# Patient Record
Sex: Female | Born: 1990 | Race: White | Hispanic: No | Marital: Married | Smoking: Never smoker
Health system: Southern US, Community
[De-identification: ages and names within clinical notes are randomized; demographics above are authoritative.]

## PROBLEM LIST (undated history)

## (undated) HISTORY — PX: NO PAST SURGERIES: SHX2092

---

## 2017-05-15 ENCOUNTER — Telehealth: Payer: Self-pay | Admitting: Podiatry

## 2017-05-15 NOTE — Telephone Encounter (Signed)
Left voicemail for pt to call to possible move appt to tomorrow at 900am. We had a cancellation.

## 2017-05-16 ENCOUNTER — Ambulatory Visit: Payer: BC Managed Care – PPO | Admitting: Podiatry

## 2017-05-16 DIAGNOSIS — L600 Ingrowing nail: Secondary | ICD-10-CM | POA: Diagnosis not present

## 2017-05-16 MED ORDER — GENTAMICIN SULFATE 0.1 % EX CREA: 1.0000 "application " | TOPICAL_CREAM | Freq: Three times a day (TID) | CUTANEOUS | 0 refills | Status: DC

## 2017-05-16 MED ORDER — DOXYCYCLINE HYCLATE 100 MG PO TABS: 100.0000 mg | ORAL_TABLET | Freq: Two times a day (BID) | ORAL | 0 refills | Status: DC

## 2017-05-16 NOTE — Progress Notes (Signed)
   Subjective:    Patient ID: Melissa Christensen, female    DOB: [birth year], 27 y.o.   MRN: [id number]  HPI    Review of Systems  All other systems reviewed and are negative.      Objective:   Physical Exam        Assessment & Plan:

## 2017-05-18 NOTE — Progress Notes (Signed)
   Subjective: Patient presents today for evaluation of pain to the lateral borders of bilateral great toes that began two weeks ago. She reports associated erythema, swelling and green drainage. Patient is concerned for possible ingrown nails. She has not done anything to treat the symptoms. Applying pressure increases the pain. There are no alleviating factors noted. Patient presents today for further treatment and evaluation.  No past medical history on file.  Objective:  General: Well developed, nourished, in no acute distress, alert and oriented x3   Dermatology: Skin is warm, dry and supple bilateral. Lateral borders of bilateral great toes appears to be erythematous with evidence of an ingrowing nail. Pain on palpation noted to the border of the nail fold. The remaining nails appear unremarkable at this time. There are no open sores, lesions.  Vascular: Dorsalis Pedis artery and Posterior Tibial artery pedal pulses palpable. No lower extremity edema noted.   Neruologic: Grossly intact via light touch bilateral.  Musculoskeletal: Muscular strength within normal limits in all groups bilateral. Normal range of motion noted to all pedal and ankle joints.   Assesement: #1 Paronychia with ingrowing nail lateral borders of bilateral great toes #2 Pain in toe #3 Incurvated nail  Plan of Care:  1. Patient evaluated.  2. Discussed treatment alternatives and plan of care. Explained nail avulsion procedure and post procedure course to patient. 3. Patient opted for permanent partial nail avulsion.  4. Prior to procedure, local anesthesia infiltration utilized using 3 ml of a 50:50 mixture of 2% plain lidocaine and 0.5% plain marcaine in a normal hallux block fashion and a betadine prep performed.  5. Partial permanent nail avulsion with chemical matrixectomy performed using 3x30sec applications of phenol followed by alcohol flush.  6. Light dressing applied. 7. Prescription for gentamicin  cream provided to patient. 8. Prescription for Doxycycline #20 provided to patient.  9. Return to clinic in 2 weeks.   Felecia ShellingBrent M. Nanako Stopher, DPM [facility]  Dr. Felecia ShellingBrent M. Donnice Nielsen, DPM    [address]                                        [place], [place] [address]                Office [phone]  Fax [phone]

## 2017-05-20 ENCOUNTER — Ambulatory Visit: Payer: Self-pay | Admitting: Podiatry

## 2017-05-30 ENCOUNTER — Ambulatory Visit: Payer: BC Managed Care – PPO | Admitting: Podiatry

## 2018-03-05 ENCOUNTER — Ambulatory Visit: Payer: Self-pay | Admitting: Family Medicine

## 2018-03-10 ENCOUNTER — Ambulatory Visit: Payer: BC Managed Care – PPO | Admitting: Family Medicine

## 2018-03-10 ENCOUNTER — Encounter (INDEPENDENT_AMBULATORY_CARE_PROVIDER_SITE_OTHER): Payer: Self-pay

## 2018-03-10 ENCOUNTER — Encounter: Payer: Self-pay | Admitting: Family Medicine

## 2018-03-10 ENCOUNTER — Ambulatory Visit: Payer: Self-pay | Admitting: Family Medicine

## 2018-03-10 VITALS — BP 110/70 | HR 83 | Temp 98.5°F | Resp 16 | Ht 62.0 in | Wt 135.0 lb

## 2018-03-10 DIAGNOSIS — L308 Other specified dermatitis: Secondary | ICD-10-CM

## 2018-03-10 DIAGNOSIS — L309 Dermatitis, unspecified: Secondary | ICD-10-CM | POA: Insufficient documentation

## 2018-03-10 MED ORDER — TRIAMCINOLONE ACETONIDE 0.1 % EX CREA: 1.0000 "application " | TOPICAL_CREAM | Freq: Two times a day (BID) | CUTANEOUS | 1 refills | Status: DC

## 2018-03-10 MED ORDER — DESONIDE 0.05 % EX LOTN: TOPICAL_LOTION | CUTANEOUS | 0 refills | Status: DC

## 2018-03-10 NOTE — Progress Notes (Signed)
Name: Melissa Christensen   MRN: [id number]    DOB: 05-Nov-1990   Date:03/10/2018       Progress Note  Subjective  Chief Complaint  Chief Complaint  Patient presents with  . Establish Care  . Otalgia    right ear     HPI  Pt presents to establish care - she has been seeing OB/GYN, but has not had a PCP in several years.  She has a 97mo daughter at home, she is married, and has 2 dogs.   Eczema:  She notes that she has had dry skin/itching in the right ear for a few months. Does not have nasal congestion.  Does have some ear pressure when she sleeps on it. She also has a patch on the left posterior lower scalp that has been itching and scaling for a few months.  She saw a dermatologist for skin survey a few months ago, but did not mention these concerns then.  Patient Active Problem List   Diagnosis Date Noted  . Eczema 03/10/2018    Past Surgical History:  Procedure Laterality Date  . NO PAST SURGERIES      Family History  Problem Relation Age of Onset  . Hypertension Mother   . Healthy Father   . Healthy Sister   . Dementia Maternal Grandmother   . Emphysema Maternal Grandfather [id number]  . Diabetes Paternal Grandmother   . Heart attack Paternal Grandfather        In his 1650's  . Healthy Sister     Social History   Socioeconomic History  . Marital status: Married    Spouse name: Josh  . Number of children: 1  . Years of education: Not on file  . Highest education level: Not on file  Occupational History  . Not on file  Social Needs  . Financial resource strain: Not hard at all  . Food insecurity:    Worry: Never true    Inability: Never true  . Transportation needs:    Medical: No    Non-medical: No  Tobacco Use  . Smoking status: Never Smoker  . Smokeless tobacco: Never Used  Substance and Sexual Activity  . Alcohol use: Never    Frequency: Never  . Drug use: Never  . Sexual activity: Yes    Partners: Male    Birth control/protection: Pill  Lifestyle  .  Physical activity:    Days per week: 0 days    Minutes per session: 0 min  . Stress: Not at all  Relationships  . Social connections:    Talks on phone: More than three times a week    Gets together: More than three times a week    Attends religious service: More than 4 times per year    Active member of club or organization: Yes    Attends meetings of clubs or organizations: Never    Relationship status: Married  . Intimate partner violence:    Fear of current or ex partner: No    Emotionally abused: No    Physically abused: No    Forced sexual activity: No  Other Topics Concern  . Not on file  Social History Narrative   She has a 97mo daughter at home, she is married, and has 2 dogs.      Current Outpatient Medications:  .  norgestrel-ethinyl estradiol (LO/OVRAL,CRYSELLE) 0.3-30 MG-MCG tablet, Take by mouth., Disp: , Rfl:  .  desonide (DESOWEN) 0.05 % lotion, Apply twice daily in thin layer  to the outer aspect of the ear canal only - do not apply internally., Disp: 59 mL, Rfl: 0 .  triamcinolone cream (KENALOG) 0.1 %, Apply 1 application topically 2 (two) times daily., Disp: 30 g, Rfl: 1  No Known Allergies  I personally reviewed active problem list, medication list, allergies, family history, social history, health maintenance with the patient/caregiver today.   ROS Constitutional: Negative for fever or weight change.  Respiratory: Negative for cough and shortness of breath.   Cardiovascular: Negative for chest pain or palpitations.  Gastrointestinal: Negative for abdominal pain, no bowel changes.  Musculoskeletal: Negative for gait problem or joint swelling.  Skin: Positive for rash.  Neurological: Negative for dizziness or headache.  No other specific complaints in a complete review of systems (except as listed in HPI above).  Objective  Vitals:   03/10/18 0723  BP: 110/70  Pulse: 83  Resp: 16  Temp: 98.5 F (36.9 C)  TempSrc: Oral  SpO2: 99%  Weight: 135  lb (61.2 kg)  Height: 5\' 2"  (1.575 m)    Body mass index is 24.69 kg/m.  Physical Exam Constitutional: Patient appears well-developed and well-nourished. No distress.  HEENT: head atraumatic, normocephalic, pupils equal and reactive to light, Bilateral TM's without erythema or effusion,  bilateral maxillary and frontal sinuses are non-tender, neck supple without lymphadenopathy, throat within normal limits - no erythema or exudate, no tonsillar swelling Cardiovascular: Normal rate, regular rhythm and normal heart sounds.  No murmur heard. No BLE edema. Pulmonary/Chest: Effort normal and breath sounds clear bilaterally. No respiratory distress. Psychiatric: Patient has a normal mood and affect. behavior is normal. Judgment and thought content normal. Skin: There a an approx quarter-sized patch of scaling skin with mild underlying erythema to the lower posterior scalp, just within the hairline to the left.  The entrance tot he right ear canal exhibits small amount of scaling and very mild erythema, canals are otherwise WNL bilaterally.  No results found for this or any previous visit (from the past 72 hour(s)).  PHQ2/9: Depression screen PHQ 2/9 03/10/2018  Decreased Interest 0  Down, Depressed, Hopeless 0  PHQ - 2 Score 0  Altered sleeping 0  Tired, decreased energy 0  Change in appetite 0  Feeling bad or failure about yourself  0  Trouble concentrating 0  Moving slowly or fidgety/restless 0  Suicidal thoughts 0  PHQ-9 Score 0  Difficult doing work/chores Not difficult at all    Fall Risk: Fall Risk  03/10/2018  Falls in the past year? 0  Number falls in past yr: 0  Injury with Fall? 0   Assessment & Plan  1. Other eczema - desonide (DESOWEN) 0.05 % lotion; Apply twice daily in thin layer to the outer aspect of the ear canal only - do not apply internally.  Dispense: 59 mL; Refill: 0 - triamcinolone cream (KENALOG) 0.1 %; Apply 1 application topically 2 (two) times daily.   Dispense: 30 g; Refill: 1

## 2018-05-01 ENCOUNTER — Encounter: Payer: Self-pay | Admitting: Family Medicine

## 2018-05-01 ENCOUNTER — Ambulatory Visit: Payer: BC Managed Care – PPO | Admitting: Family Medicine

## 2018-05-01 VITALS — BP 120/70 | HR 88 | Temp 98.8°F | Resp 14 | Ht 63.0 in | Wt 135.2 lb

## 2018-05-01 DIAGNOSIS — O99345 Other mental disorders complicating the puerperium: Secondary | ICD-10-CM | POA: Diagnosis not present

## 2018-05-01 DIAGNOSIS — F418 Other specified anxiety disorders: Secondary | ICD-10-CM | POA: Diagnosis not present

## 2018-05-01 DIAGNOSIS — N644 Mastodynia: Secondary | ICD-10-CM | POA: Diagnosis not present

## 2018-05-01 LAB — COMPLETE METABOLIC PANEL WITH GFR
AG Ratio: 1.3 (calc) (ref 1.0–2.5)
ALKALINE PHOSPHATASE (APISO): 48 U/L (ref 33–115)
ALT: 10 U/L (ref 6–29)
AST: 13 U/L (ref 10–30)
Albumin: 4.3 g/dL (ref 3.6–5.1)
BUN: 13 mg/dL (ref 7–25)
CALCIUM: 9.9 mg/dL (ref 8.6–10.2)
CO2: 25 mmol/L (ref 20–32)
CREATININE: 0.8 mg/dL (ref 0.50–1.10)
Chloride: 104 mmol/L (ref 98–110)
GFR, Est African American: 117 mL/min/{1.73_m2} (ref >=60)
GFR, Est Non African American: 101 mL/min/{1.73_m2} (ref >=60)
GLUCOSE: 92 mg/dL (ref 65–99)
Globulin: 3.4 g/dL (calc) (ref 1.9–3.7)
Potassium: 4.5 mmol/L (ref 3.5–5.3)
Sodium: 139 mmol/L (ref 135–146)
Total Bilirubin: 0.4 mg/dL (ref 0.2–1.2)
Total Protein: 7.7 g/dL (ref 6.1–8.1)

## 2018-05-01 LAB — CBC WITH DIFFERENTIAL/PLATELET
ABSOLUTE MONOCYTES: 599 {cells}/uL (ref 200–950)
Basophils Absolute: 73 cells/uL (ref 0–200)
Basophils Relative: 1 %
Eosinophils Absolute: 51 cells/uL (ref 15–500)
Eosinophils Relative: 0.7 %
HEMATOCRIT: 40.3 % (ref 35.0–45.0)
Hemoglobin: 13.2 g/dL (ref 11.7–15.5)
LYMPHS ABS: 2832 {cells}/uL (ref 850–3900)
MCH: 26.7 pg — ABNORMAL LOW (ref 27.0–33.0)
MCHC: 32.8 g/dL (ref 32.0–36.0)
MCV: 81.4 fL (ref 80.0–100.0)
MPV: 10.5 fL (ref 7.5–12.5)
Monocytes Relative: 8.2 %
NEUTROS ABS: 3745 {cells}/uL (ref 1500–7800)
Neutrophils Relative %: 51.3 %
PLATELETS: 391 10*3/uL (ref 140–400)
RBC: 4.95 10*6/uL (ref 3.80–5.10)
RDW: 13.4 % (ref 11.0–15.0)
TOTAL LYMPHOCYTE: 38.8 %
WBC: 7.3 10*3/uL (ref 3.8–10.8)

## 2018-05-01 LAB — TSH: TSH: 1.63 mIU/L

## 2018-05-01 MED ORDER — ESCITALOPRAM OXALATE 5 MG PO TABS
5.0000 mg | ORAL_TABLET | Freq: Every day | ORAL | 1 refills | Status: DC
Start: 2018-05-01 — End: 2018-05-22

## 2018-05-01 NOTE — Progress Notes (Signed)
Name: Melissa Christensen   MRN: [id number]    DOB: December 28, 1990   Date:05/01/2018       Progress Note  Subjective  Chief Complaint  Chief Complaint  Patient presents with  . Shoulder Pain    that radiates to breast on right for months  . Anxiety    since child birth    HPI  RIGHT breast, axillary, and shoulder blade pain ongoing for several months - was seen by GYN and had normal breast exam in October 2019 despite having the pain then. She will have sharp intermittent pains throughout the day. She states not related to her menses, not related to caffeine. She is not breastfeeding. Does carry her 17mo daughter on her right side often.  No nipple discharge, though she notes some nipple discomfort with the pain at times.   Anxiety/Fatigue: Ongoing since her daughter was born.  She has a constant fear that something bad is going to happen to her daugher, she worries about taking her daughter out of the home with her.  She feels irritable with her husband and other caregivers when they don't do things the way that she prefers.    GAD 7 : Generalized Anxiety Score 05/01/2018  Nervous, Anxious, on Edge 3  Control/stop worrying 3  Worry too much - different things 3  Trouble relaxing 1  Restless 0  Easily annoyed or irritable 3  Afraid - awful might happen 3  Total GAD 7 Score 16  Anxiety Difficulty Extremely difficult      Patient Active Problem List   Diagnosis Date Noted  . Eczema 03/10/2018    Social History   Tobacco Use  . Smoking status: Never Smoker  . Smokeless tobacco: Never Used  Substance Use Topics  . Alcohol use: Never    Frequency: Never     Current Outpatient Medications:  .  desonide (DESOWEN) 0.05 % lotion, Apply twice daily in thin layer to the outer aspect of the ear canal only - do not apply internally., Disp: 59 mL, Rfl: 0 .  norgestrel-ethinyl estradiol (LO/OVRAL,CRYSELLE) 0.3-30 MG-MCG tablet, Take by mouth., Disp: , Rfl:  .  triamcinolone cream  (KENALOG) 0.1 %, Apply 1 application topically 2 (two) times daily., Disp: 30 g, Rfl: 1  No Known Allergies  I personally reviewed active problem list, medication list, allergies, notes from last encounter with the patient/caregiver today.  ROS  Constitutional: Negative for fever or weight change.  Respiratory: Negative for cough and shortness of breath.   Cardiovascular: Negative for chest pain or palpitations.  Gastrointestinal: Negative for abdominal pain, no bowel changes.  Musculoskeletal: Negative for gait problem or joint swelling.  Skin: Negative for rash.  Neurological: Negative for dizziness or headache.  No other specific complaints in a complete review of systems (except as listed in HPI above).  Objective  Vitals:   05/01/18 1346  BP: 120/70  Pulse: 88  Resp: 14  Temp: 98.8 F (37.1 C)  TempSrc: Oral  SpO2: 98%  Weight: 135 lb 3.2 oz (61.3 kg)  Height: 5\' 3"  (1.6 m)   Body mass index is 23.95 kg/m.  Nursing Note and Vital Signs reviewed.  Physical Exam  Constitutional: Patient appears well-developed and well-nourished. No distress.  HENT: Head: Normocephalic and atraumatic. Ears: B TMs ok, no erythema or effusion; Nose: Nose normal. Mouth/Throat: Oropharynx is clear and moist. No oropharyngeal exudate.  Eyes: Conjunctivae and EOM are normal. Pupils are equal, round, and reactive to light. No scleral icterus.  Neck: Normal range of motion. Neck supple. No JVD present. No thyromegaly present.  Cardiovascular: Normal rate, regular rhythm and normal heart sounds.  No murmur heard. No BLE edema. Pulmonary/Chest: Effort normal and breath sounds normal. No respiratory distress. Abdominal: Soft. Bowel sounds are normal, no distension. There is no tenderness. no masses Breast: no nipple discharge or rashes; there is some dense irregular tissue to the RIGHT upper outer quadrant of the right breast, left breast is normal. Neurological: he is alert and oriented to  person, place, and time. No cranial nerve deficit. Coordination, balance, strength, speech and gait are normal.  Skin: Skin is warm and dry. No rash noted. No erythema.  Psychiatric: Patient has a normal mood and affect. behavior is normal. Judgment and thought content normal.  No results found for this or any previous visit (from the past 72 hour(s)).  Assessment & Plan  1. Postpartum anxiety - escitalopram (LEXAPRO) 5 MG tablet; Take 1 tablet (5 mg total) by mouth daily.  Dispense: 30 tablet; Refill: 1 - TSH - COMPLETE METABOLIC PANEL WITH GFR - CBC w/Diff/Platelet  2. Breast pain, right - TSH - COMPLETE METABOLIC PANEL WITH GFR - CBC w/Diff/Platelet - US BREAST LTD UNI RIGHT INC AXILLA; Future - US BREAST LTD UNI LEFT INC AXILLA; Future

## 2018-05-01 NOTE — Patient Instructions (Signed)
[email or web address] therapist finder   Perinatal Anxiety When a woman feels excessive tension or worry (anxiety) during pregnancy or during the first 12 months after she gives birth, she has a condition called perinatal anxiety. Anxiety can interfere with work, school, relationships, and other everyday activities. If it is not managed properly, it can also cause problems in the mother and her baby.  If you are pregnant and you have symptoms of an anxiety disorder, it is important to talk with your health care provider. What are the causes? The exact cause of this condition is not known. Hormonal changes during and after pregnancy may play a role in causing perinatal anxiety. What increases the risk? You are more likely to develop this condition if:  You have a personal or family history of depression, anxiety, or mood disorders.  You experience a stressful life event during pregnancy, such as the death of a loved one.  You have a lot of regular life stress, such as being a single parent.  You have thyroid problems. What are the signs or symptoms? Perinatal anxiety can be different for everyone. It may include:  Panic attacks (panic disorder). These are intense episodes of fear or discomfort that may also cause sweating, nausea, shortness of breath, or fear of dying. They usually last 5-15 minutes.  Reliving an upsetting (traumatic) event through distressing thoughts, dreams, or flashbacks (post-traumatic stress disorder, or PTSD).  Excessive worry about multiple problems (generalized anxiety disorder).  Fear and stress about leaving certain people or loved ones (separation anxiety).  Performing repetitive tasks (compulsions) to relieve stress or worry (obsessive compulsive disorder, or OCD).  Fear of certain objects or situations (phobias).  Excessive worrying, such as a constant feeling that something bad is going to happen.  Inability to relax.  Difficulty  concentrating.  Sleep problems.  Frequent nightmares or disturbing thoughts. How is this diagnosed? This condition is diagnosed based on a physical exam and mental evaluation. In some cases, your health care provider may use an anxiety screening tool. These tools include a list of questions that can help a health care provider diagnose anxiety. Your health care provider may refer you to a mental health expert who specializes in anxiety. How is this treated? This condition may be treated with:  Medicines. Your health care provider will only give you medicines that have been proven safe for pregnancy and breastfeeding.  Talk therapy with a mental health professional to help change your patterns of thinking (cognitive behavioral therapy).  Mindfulness-based stress reduction.  Other relaxation therapies, such as deep breathing or guided muscle relaxation.  Support groups. Follow these instructions at home: Lifestyle  Do not use any products that contain nicotine or tobacco, such as cigarettes and e-cigarettes. If you need help quitting, ask your health care provider.  Do not use alcohol when you are pregnant. After your baby is born, limit alcohol intake to no more than 1 drink a day. One drink equals 12 oz of beer, 5 oz of wine, or 1 oz of hard liquor.  Consider joining a support group for new mothers. Ask your health care provider for recommendations.  Take good care of yourself. Make sure you: ? Get plenty of sleep. If you are having trouble sleeping, talk with your health care provider. ? Eat a healthy diet. This includes plenty of fruits and vegetables, whole grains, and lean proteins. ? Exercise regularly, as told by your health care provider. Ask your health care provider what exercises are safe for you.  General instructions  Take over-the-counter and prescription medicines only as told by your health care provider.  Talk with your partner or family members about your feelings  during pregnancy. Share any concerns or fears that you may have.  Ask for help with tasks or chores when you need it. Ask friends and family members to provide meals, watch your children, or help with cleaning.  Keep all follow-up visits as told by your health care provider. This is important. Contact a health care provider if:  You (or people close to you) notice that you have any symptoms of anxiety or depression.  You have anxiety and your symptoms get worse.  You experience side effects from medicines, such as nausea or sleep problems. Get help right away if:  You feel like hurting yourself, your baby, or someone else. If you ever feel like you may hurt yourself or others, or have thoughts about taking your own life, get help right away. You can go to your nearest emergency department or call:  Your local emergency services (911 in the [place]  A suicide crisis helpline, such as the National Suicide Prevention Lifeline at 319-545-8812. This is open 24 hours a day. Summary  Perinatal anxiety is when a woman feels excessive tension or worry during pregnancy or during the first 12 months after she gives birth.  Perinatal anxiety may include panic attacks, post-traumatic stress disorder, separation anxiety, phobias, or generalized anxiety.  Perinatal anxiety can cause physical health problems in the mother and baby if not properly managed.  This condition is treated with medicines, talk therapy, stress reduction therapies, or a combination of two or more treatments.  Talk with your partner or family members about your concerns or fears. Do not be afraid to ask for help. This information is not intended to replace advice given to you by your health care provider. Make sure you discuss any questions you have with your health care provider. Document Released: 05/29/2016 Document Revised: 05/29/2016 Document Reviewed: 05/29/2016 Elsevier Interactive Patient Education  2019 [employer].   12 Ways to [employer] Anxiety  ?Anxiety is normal human sensation. It is what helped our ancestors survive the pitfalls of the wilderness. Anxiety is defined as experiencing worry or nervousness about an imminent event or something with an uncertain outcome. It is a feeling experienced by most people at some point in their lives. Anxiety can be triggered by a very personal issue, such as the illness of a loved one, or an event of global proportions, such as a refugee crisis. Some of the symptoms of anxiety are:  Feeling restless.  Having a feeling of impending danger.  Increased heart rate.  Rapid breathing. Sweating.  Shaking.  Weakness or feeling tired.  Difficulty concentrating on anything except the current worry.  Insomnia.  Stomach or bowel problems. What can we do about anxiety we may be feeling? There are many techniques to help manage stress and relax. Here are 12 ways you can reduce your anxiety almost immediately: 1. Turn off the constant feed of information. Take a social media sabbatical. Studies have shown that social media directly contributes to social anxiety.  2. Monitor your television viewing habits. Are you watching shows that are also contributing to your anxiety, such as 24-hour news stations? Try watching something else, or better yet, nothing at all. Instead, listen to music, read an inspirational book or practice a hobby. 3. Eat nutritious meals. Also, don't skip meals and keep healthful snacks on hand. Hunger and  poor diet contributes to feeling anxious. 4. Sleep. Sleeping on a regular schedule for at least seven to eight hours a night will do wonders for your outlook when you are awake. 5. Exercise. Regular exercise will help rid your body of that anxious energy and help you get more restful sleep. 6. Try deep (diaphragmatic) breathing. Inhale slowly through your nose for five seconds and exhale through your mouth. 7. Practice acceptance and gratitude. When anxiety  hits, accept that there are things out of your control that shouldn't be of immediate concern.  8. Seek out humor. When anxiety strikes, watch a funny video, read jokes or call a friend who makes you laugh. Laughter is healing for our bodies and releases endorphins that are calming. 9. Stay positive. Take the effort to replace negative thoughts with positive ones. Try to see a stressful situation in a positive light. Try to come up with solutions rather than dwelling on the problem. 10. Figure out what triggers your anxiety. Keep a journal and make note of anxious moments and the events surrounding them. This will help you identify triggers you can avoid or even eliminate. 11. Talk to someone. Let a trusted friend, family member or even trained professional know that you are feeling overwhelmed and anxious. Verbalize what you are feeling and why.  12. Volunteer. If your anxiety is triggered by a crisis on a large scale, become an advocate and work to resolve the problem that is causing you unease. Anxiety is often unwelcome and can become overwhelming. If not kept in check, it can become a disorder that could require medical treatment. However, if you take the time to care for yourself and avoid the triggers that make you anxious, you will be able to find moments of relaxation and clarity that make your life much more enjoyable.

## 2018-05-13 ENCOUNTER — Ambulatory Visit
Admission: RE | Admit: 2018-05-13 | Discharge: 2018-05-13 | Disposition: A | Discharge status: Home / Self Care | Payer: BC Managed Care – PPO | Source: Ambulatory Visit | Attending: Family Medicine | Admitting: Family Medicine

## 2018-05-13 DIAGNOSIS — N644 Mastodynia: Secondary | ICD-10-CM | POA: Insufficient documentation

## 2018-05-22 ENCOUNTER — Other Ambulatory Visit: Payer: Self-pay

## 2018-05-22 ENCOUNTER — Encounter: Payer: Self-pay | Admitting: Family Medicine

## 2018-05-22 ENCOUNTER — Ambulatory Visit (INDEPENDENT_AMBULATORY_CARE_PROVIDER_SITE_OTHER): Payer: BC Managed Care – PPO | Admitting: Family Medicine

## 2018-05-22 VITALS — BP 110/70 | HR 82 | Temp 98.0°F | Resp 16 | Ht 63.0 in | Wt 135.1 lb

## 2018-05-22 DIAGNOSIS — O99345 Other mental disorders complicating the puerperium: Secondary | ICD-10-CM | POA: Insufficient documentation

## 2018-05-22 DIAGNOSIS — F418 Other specified anxiety disorders: Secondary | ICD-10-CM | POA: Diagnosis not present

## 2018-05-22 MED ORDER — ESCITALOPRAM OXALATE 10 MG PO TABS: 10.0000 mg | ORAL_TABLET | Freq: Every day | ORAL | 1 refills | Status: DC

## 2018-05-22 NOTE — Patient Instructions (Addendum)
Take 7.5 of Lexapro daily (1/5 of the 5mg  tablets) until you run out.  Then start 10mg  once daily tablets.   [email or web address] therapist finder [employer]

## 2018-05-22 NOTE — Assessment & Plan Note (Signed)
Increase lexapro to 10mg . GAD7 score is improved to score of 10.  She needs to find a counselor - see AVS.

## 2018-05-22 NOTE — Progress Notes (Signed)
Established Patient Office Visit  Subjective:  Patient ID: Melissa Christensen, female    DOB: May 16, 1990  Age: 28 y.o. MRN: [id number]  CC:  Chief Complaint  Patient presents with  . Follow-up    HPI Melissa Christensen presents for follow up:  Anxiety/Fatigue: Ongoing since her daughter was born.  She has a constant fear that something bad is going to happen to her daugher, she worries about taking her daughter out of the home with her.  She feels irritable with her husband and other caregivers when they don't do things the way that she prefers.  She is not breastfeeding. Started Lexapro 5mg  daily 3 weeks ago, and she has noticed some improvement in her anxiety - GAD7 has improved since last visit, however she would like to increase dose today for additional efficacy.  Daughter is still getting up 1-2 times in the middle of the night. Discussed anxiolytics including buspar, and she declines at this time.  We will increase Lexapro today.  Depression screen [facility] 2/9 05/22/2018 05/01/2018 03/10/2018  Decreased Interest 0 1 0  Down, Depressed, Hopeless 0 0 0  PHQ - 2 Score 0 1 0  Altered sleeping 1 0 0  Tired, decreased energy 1 3 0  Change in appetite 0 0 0  Feeling bad or failure about yourself  0 0 0  Trouble concentrating 0 0 0  Moving slowly or fidgety/restless 0 0 0  Suicidal thoughts 0 0 0  PHQ-9 Score 2 4 0  Difficult doing work/chores Not difficult at all Not difficult at all Not difficult at all   GAD 7 : Generalized Anxiety Score 05/22/2018 05/01/2018  Nervous, Anxious, on Edge 2 3  Control/stop worrying 2 3  Worry too much - different things 1 3  Trouble relaxing 1 1  Restless 0 0  Easily annoyed or irritable 2 3  Afraid - awful might happen 2 3  Total GAD 7 Score 10 16  Anxiety Difficulty Not difficult at all Extremely difficult   No past medical history on file.  Past Surgical History:  Procedure Laterality Date  . NO PAST SURGERIES      Family History  Problem Relation Age  of Onset  . Hypertension Mother   . Healthy Father   . Healthy Sister   . Dementia Maternal Grandmother   . Emphysema Maternal Grandfather 31  . Diabetes Paternal Grandmother   . Heart attack Paternal Grandfather        In his 51's  . Healthy Sister     Social History   Socioeconomic History  . Marital status: Married    Spouse name: Josh  . Number of children: 1  . Years of education: Not on file  . Highest education level: Not on file  Occupational History  . Not on file  Social Needs  . Financial resource strain: Not hard at all  . Food insecurity:    Worry: Never true    Inability: Never true  . Transportation needs:    Medical: No    Non-medical: No  Tobacco Use  . Smoking status: Never Smoker  . Smokeless tobacco: Never Used  Substance and Sexual Activity  . Alcohol use: Never    Frequency: Never  . Drug use: Never  . Sexual activity: Yes    Partners: Male    Birth control/protection: Pill  Lifestyle  . Physical activity:    Days per week: 0 days    Minutes per session: 0 min  .  Stress: Not at all  Relationships  . Social connections:    Talks on phone: More than three times a week    Gets together: More than three times a week    Attends religious service: More than 4 times per year    Active member of club or organization: Yes    Attends meetings of clubs or organizations: Never    Relationship status: Married  . Intimate partner violence:    Fear of current or ex partner: No    Emotionally abused: No    Physically abused: No    Forced sexual activity: No  Other Topics Concern  . Not on file  Social History Narrative   She has a 15mo daughter at home, she is married, and has 2 dogs.     Outpatient Medications Prior to Visit  Medication Sig Dispense Refill  . desonide (DESOWEN) 0.05 % lotion Apply twice daily in thin layer to the outer aspect of the ear canal only - do not apply internally. 59 mL 0  . triamcinolone cream (KENALOG) 0.1 % Apply  1 application topically 2 (two) times daily. 30 g 1  . escitalopram (LEXAPRO) 5 MG tablet Take 1 tablet (5 mg total) by mouth daily. 30 tablet 1  . norgestrel-ethinyl estradiol (LO/OVRAL,CRYSELLE) 0.3-30 MG-MCG tablet Take by mouth.     No facility-administered medications prior to visit.     No Known Allergies  ROS Review of Systems  Constitutional: Negative for activity change and appetite change.  HENT: Negative.   Respiratory: Negative.  Negative for shortness of breath.   Cardiovascular: Negative.  Negative for chest pain.  Gastrointestinal: Negative.  Negative for constipation, diarrhea and nausea.  Musculoskeletal: Negative.   Skin: Negative.   Psychiatric/Behavioral: Negative for confusion, dysphoric mood, sleep disturbance and suicidal ideas. The patient is nervous/anxious.   All other systems reviewed and are negative.  Objective:    Physical Exam  Constitutional: She is oriented to person, place, and time. She appears well-developed and well-nourished. No distress.  HENT:  Head: Normocephalic and atraumatic.  Right Ear: External ear normal.  Left Ear: External ear normal.  Nose: Nose normal.  Mouth/Throat: Oropharynx is clear and moist. No oropharyngeal exudate.  Eyes: Pupils are equal, round, and reactive to light. Conjunctivae and EOM are normal.  Neck: Normal range of motion. Neck supple. No JVD present. No thyromegaly present.  Cardiovascular: Normal rate and regular rhythm. Exam reveals no gallop and no friction rub.  No murmur heard. Pulmonary/Chest: Breath sounds normal. She has no wheezes. She has no rales. She exhibits no tenderness.  Musculoskeletal: Normal range of motion.        General: No tenderness or edema.  Lymphadenopathy:    She has no cervical adenopathy.  Neurological: She is alert and oriented to person, place, and time. No cranial nerve deficit.  Skin: Skin is warm and dry. No rash noted.  Psychiatric: She has a normal mood and affect. Her  behavior is normal. Judgment and thought content normal.  Nursing note and vitals reviewed.  BP 110/70 (BP Location: Right Arm, Patient Position: Sitting, Cuff Size: Normal)   Pulse 82   Temp 98 F (36.7 C) (Oral)   Resp 16   Ht 5\' 3"  (1.6 m)   Wt 135 lb 1.6 oz (61.3 kg)   LMP 04/30/2018   SpO2 99%   BMI 23.93 kg/m  Wt Readings from Last 3 Encounters:  05/22/18 135 lb 1.6 oz (61.3 kg)  05/01/18 135 lb  3.2 oz (61.3 kg)  03/10/18 135 lb (61.2 kg)    Health Maintenance Due  Topic Date Due  . HIV Screening  07/31/2005  . TETANUS/TDAP  07/31/2009  . PAP-Cervical Cytology Screening  08/01/2011  . PAP SMEAR-Modifier  08/01/2011    There are no preventive care reminders to display for this patient.  Lab Results  Component Value Date   TSH 1.63 05/01/2018   Lab Results  Component Value Date   WBC 7.3 05/01/2018   HGB 13.2 05/01/2018   HCT 40.3 05/01/2018   MCV 81.4 05/01/2018   PLT 391 05/01/2018   Lab Results  Component Value Date   NA 139 05/01/2018   K 4.5 05/01/2018   CO2 25 05/01/2018   GLUCOSE 92 05/01/2018   BUN 13 05/01/2018   CREATININE 0.80 05/01/2018   BILITOT 0.4 05/01/2018   AST 13 05/01/2018   ALT 10 05/01/2018   PROT 7.7 05/01/2018   CALCIUM 9.9 05/01/2018   No results found for: CHOL No results found for: HDL No results found for: LDLCALC No results found for: TRIG No results found for: CHOLHDL No results found for: [id number]    Assessment & Plan:   Problem List Items Addressed This Visit      Other   Postpartum anxiety - Primary    Increase lexapro to 10mg . GAD7 score is improved to score of 10.  She needs to find a counselor - see AVS.      Relevant Medications   escitalopram (LEXAPRO) 10 MG tablet     Meds ordered this encounter  Medications  . escitalopram (LEXAPRO) 10 MG tablet    Sig: Take 1 tablet (10 mg total) by mouth at bedtime.    Dispense:  90 tablet    Refill:  1    Order Specific Question:   Supervising Provider     Answer:   Alba CorySOWLES, KRICHNA [[id number]]   Follow-up: Return in about 2 months (around 07/21/2018) for follow up.   Doren CustardEmily E Mose Colaizzi, FNP

## 2018-07-29 ENCOUNTER — Encounter: Payer: Self-pay | Admitting: Family Medicine

## 2018-07-31 ENCOUNTER — Ambulatory Visit (INDEPENDENT_AMBULATORY_CARE_PROVIDER_SITE_OTHER): Payer: BC Managed Care – PPO | Admitting: Family Medicine

## 2018-07-31 ENCOUNTER — Encounter: Payer: Self-pay | Admitting: Family Medicine

## 2018-07-31 ENCOUNTER — Other Ambulatory Visit: Payer: Self-pay

## 2018-07-31 DIAGNOSIS — N760 Acute vaginitis: Secondary | ICD-10-CM | POA: Diagnosis not present

## 2018-07-31 MED ORDER — FLUCONAZOLE 150 MG PO TABS: 150.0000 mg | ORAL_TABLET | Freq: Once | ORAL | 0 refills | Status: AC

## 2018-07-31 NOTE — Progress Notes (Signed)
Name: Melissa Christensen   MRN: [id number]    DOB: 1991-01-29   Date:07/31/2018       Progress Note  Subjective  Chief Complaint  Chief Complaint  Patient presents with  . Vaginal Itching    has tried some yeast guard - hx of bv. she is very sensitive in that area and remembered that she has tried a different soap  . Vaginal Pain    moreso like burning - on the outside, started after her cycle.     I connected with  [name]  on 07/31/18 at  8:40 AM EDT by a video enabled telemedicine application and verified that I am speaking with the correct person using two identifiers.  I discussed the limitations of evaluation and management by telemedicine and the availability of in person appointments. The patient expressed understanding and agreed to proceed. Staff also discussed with the patient that there may be a patient responsible charge related to this service. Patient Location: Home Provider Location: Office Additional Individuals present: None  HPI  Pt presents with concern for vaginal pain and itching that started 07/29/2018.  She did use a different soap recently and this caused irritation, she did return back to her old soap since this started.  Today her pain and itching are a bit improved; still having some burning with urination - but the burning is on external genitalia. She denies abdominal tenderness in the lower abdomen.  No abnormal discharge, no abnormal smell. No NVD or abdominal pain, no flank pain.  Patient Active Problem List   Diagnosis Date Noted  . 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 Eczema 03/10/2018    Social History   Tobacco Use  . Smoking status: Never Smoker  . Smokeless tobacco: Never Used  Substance Use Topics  . Alcohol use: Never    Frequency: Never     Current Outpatient Medications:  .  desonide (DESOWEN) 0.05 % lotion, Apply twice daily in thin layer to the outer aspect of the ear canal only - do not apply internally., Disp: 59 mL, Rfl: 0 .   escitalopram (LEXAPRO) 10 MG tablet, Take 1 tablet (10 mg total) by mouth at bedtime., Disp: 90 tablet, Rfl: 1 .  LOW-OGESTREL 0.3-30 MG-MCG tablet, , Disp: , Rfl:  .  triamcinolone cream (KENALOG) 0.1 %, Apply 1 application topically 2 (two) times daily., Disp: 30 g, Rfl: 1  No Known Allergies  I personally reviewed active problem list, medication list, allergies, notes from last encounter, lab results with the patient/caregiver today.  ROS  Ten systems reviewed and is negative except as mentioned in HPI  Objective  Virtual encounter, vitals not obtained.  There is no height or weight on file to calculate BMI.  Nursing Note and Vital Signs reviewed.  Physical Exam  Constitutional: Patient appears well-developed and well-nourished. No distress.  HENT: Head: Normocephalic and atraumatic.  Neck: Normal range of motion. Pulmonary/Chest: Effort normal. No respiratory distress. Speaking in complete sentences Neurological: Pt is alert and oriented to person, place, and time. Coordination, speech and gait are normal.  Psychiatric: Patient has a normal mood and affect. behavior is normal. Judgment and thought content normal. Abdominal: Patient palpates her lower abdomen and denies tenderness.  No results found for this or any previous visit (from the past 72 hour(s)).  Assessment & Plan  1. Acute vaginitis - We will treat for yeast infection initially, will message back in 72 hours if not improving and will send in metronidazole. - fluconazole (DIFLUCAN)  150 MG tablet; Take 1 tablet (150 mg total) by mouth once for 1 dose.  Dispense: 1 tablet; Refill: 0  -Red flags and when to present for emergency care or RTC including fever >101.32F, chest pain, shortness of breath, new/worsening/un-resolving symptoms, reviewed with patient at time of visit. Follow up and care instructions discussed and provided in AVS. - I discussed the assessment and treatment plan with the patient. The patient was  provided an opportunity to ask questions and all were answered. The patient agreed with the plan and demonstrated an understanding of the instructions.  I provided 8 minutes of non-face-to-face time during this encounter.  Doren CustardEmily E Elman Dettman, FNP

## 2018-08-07 ENCOUNTER — Ambulatory Visit: Payer: BC Managed Care – PPO | Admitting: Family Medicine

## 2018-11-22 ENCOUNTER — Telehealth: Payer: Self-pay | Admitting: Family Medicine

## 2018-11-22 DIAGNOSIS — O99345 Other mental disorders complicating the puerperium: Secondary | ICD-10-CM

## 2018-11-23 NOTE — Telephone Encounter (Signed)
Please call to have patient scheduled for a follow up on her anxiety - may be virtual visit in the next 1-2 months.

## 2018-11-24 NOTE — Telephone Encounter (Signed)
lvm for pt to call and schedule her appt

## 2019-02-20 ENCOUNTER — Telehealth: Payer: Self-pay | Admitting: Family Medicine

## 2019-02-20 DIAGNOSIS — O99345 Other mental disorders complicating the puerperium: Secondary | ICD-10-CM

## 2019-02-20 DIAGNOSIS — F418 Other specified anxiety disorders: Secondary | ICD-10-CM

## 2019-02-22 NOTE — Telephone Encounter (Signed)
Requested medication (s) are due for refill today: yes  Requested medication (s) are on the active medication list: yes  Last refill:  11/23/2018  Future visit scheduled: no  Notes to clinic:  Review for refill Overdue for office visit  [place] 07/31/2018   Requested Prescriptions  Pending Prescriptions Disp Refills   escitalopram (LEXAPRO) 10 MG tablet [Pharmacy Med Name: ESCITALOPRAM 10 MG TABLET] 90 tablet 0    Sig: TAKE ONE TABLET BY MOUTH EVERY NIGHT AT BEDTIME     Psychiatry:  Antidepressants - SSRI Failed - 02/20/2019  6:50 AM      Failed - Valid encounter within last 6 months    Recent Outpatient Visits          6 months ago Acute vaginitis   [place], FNP   9 months ago Postpartum anxiety   [place], FNP   9 months ago Postpartum anxiety   [place], [place]   11 months ago Other eczema   [place], [place]             Failed - Completed PHQ-2 or PHQ-9 in the last 360 days.

## 2019-02-22 NOTE — Telephone Encounter (Signed)
Please schedule patient for follow up in the next 30 days. May be virtual.

## 2019-02-23 NOTE — Telephone Encounter (Signed)
lvm informing pt that prescription has been sent to pharmacy and for her to schedule an appt within the next 30 days and that it could be virtual

## 2019-03-18 ENCOUNTER — Encounter: Payer: Self-pay | Admitting: Family Medicine

## 2019-03-18 ENCOUNTER — Ambulatory Visit (INDEPENDENT_AMBULATORY_CARE_PROVIDER_SITE_OTHER): Payer: BC Managed Care – PPO | Admitting: Family Medicine

## 2019-03-18 ENCOUNTER — Other Ambulatory Visit: Payer: Self-pay

## 2019-03-18 DIAGNOSIS — F418 Other specified anxiety disorders: Secondary | ICD-10-CM | POA: Diagnosis not present

## 2019-03-18 DIAGNOSIS — O99345 Other mental disorders complicating the puerperium: Secondary | ICD-10-CM

## 2019-03-18 DIAGNOSIS — L308 Other specified dermatitis: Secondary | ICD-10-CM | POA: Diagnosis not present

## 2019-03-18 MED ORDER — TRIAMCINOLONE ACETONIDE 0.1 % EX CREA: 1.0000 "application " | TOPICAL_CREAM | Freq: Two times a day (BID) | CUTANEOUS | 1 refills | Status: DC

## 2019-03-18 MED ORDER — DESONIDE 0.05 % EX LOTN: TOPICAL_LOTION | CUTANEOUS | 0 refills | Status: DC

## 2019-03-18 NOTE — Progress Notes (Signed)
Name: Melissa Christensen   MRN: [id number]    DOB: 19-May-1990   Date:03/18/2019       Progress Note  Subjective  Chief Complaint  Chief Complaint  Patient presents with  . Follow-up    medication, patient has stopped meds    I connected with  Tomi Bamberger on 03/18/19 at 10:40 AM EST by telephone and verified that I am speaking with the correct person using two identifiers.  I discussed the limitations, risks, security and privacy concerns of performing an evaluation and management service by telephone and the availability of in person appointments. Staff also discussed with the patient that there may be a patient responsible charge related to this service. Patient Location: Home Provider Location: Office Additional Individuals present: None  HPI  Anxiety: Ongoing since her daughter was born (she is now 2yo - Oct 2018). She forgot her medication when going on vacation in November, and felt good without the medication, and has remained off of her Lexapro.  She denies symptoms of worry, irritability, or intense anxiety, sleeping well, mood has been very good.  She would like to remain off at this time.  Eczema: Doing well with triamcinolone PRN for the back of her neck; ear canals still flaring up here and there - desonide working well for this.    Office Visit from 03/18/2019 in [facility]  PHQ-9 Total Score  0       Patient Active Problem List   Diagnosis Date Noted  . 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 Eczema 03/10/2018    Past Surgical History:  Procedure Laterality Date  . NO PAST SURGERIES      Family History  Problem Relation Age of Onset  . Hypertension Mother   . Healthy Father   . Healthy Sister   . Dementia Maternal Grandmother   . Emphysema Maternal Grandfather 66  . Diabetes Paternal Grandmother   . Heart attack Paternal Grandfather        In his 6's  . Healthy Sister     Social History   Socioeconomic History  . Marital status:  Married    Spouse name: Josh  . Number of children: 1  . Years of education: Not on file  . Highest education level: Not on file  Occupational History  . Not on file  Social Needs  . Financial resource strain: Not hard at all  . Food insecurity    Worry: Never true    Inability: Never true  . Transportation needs    Medical: No    Non-medical: No  Tobacco Use  . Smoking status: Never Smoker  . Smokeless tobacco: Never Used  Substance and Sexual Activity  . Alcohol use: Never    Frequency: Never  . Drug use: Never  . Sexual activity: Yes    Partners: Male    Birth control/protection: Pill  Lifestyle  . Physical activity    Days per week: 5 days    Minutes per session: 30 min  . Stress: Not at all  Relationships  . Social connections    Talks on phone: More than three times a week    Gets together: More than three times a week    Attends religious service: More than 4 times per year    Active member of club or organization: Yes    Attends meetings of clubs or organizations: Never    Relationship status: Married  . Intimate partner violence    Fear of current or  ex partner: No    Emotionally abused: No    Physically abused: No    Forced sexual activity: No  Other Topics Concern  . Not on file  Social History Narrative   She has a 49mo daughter at home, she is married, and has 2 dogs.      Current Outpatient Medications:  .  desonide (DESOWEN) 0.05 % lotion, Apply twice daily in thin layer to the outer aspect of the ear canal only - do not apply internally., Disp: 59 mL, Rfl: 0 .  LOW-OGESTREL 0.3-30 MG-MCG tablet, , Disp: , Rfl:  .  triamcinolone cream (KENALOG) 0.1 %, Apply 1 application topically 2 (two) times daily., Disp: 30 g, Rfl: 1 .  escitalopram (LEXAPRO) 10 MG tablet, TAKE ONE TABLET BY MOUTH EVERY NIGHT AT BEDTIME (Patient not taking: Reported on 03/18/2019), Disp: 30 tablet, Rfl: 0  No Known Allergies  I personally reviewed active problem list,  medication list, allergies, notes from last encounter with the patient/caregiver today.   ROS  Constitutional: Negative for fever or weight change.  Respiratory: Negative for cough and shortness of breath.   Cardiovascular: Negative for chest pain or palpitations.  Gastrointestinal: Negative for abdominal pain, no bowel changes.  Musculoskeletal: Negative for gait problem or joint swelling.  Skin: Negative for rash.  Neurological: Negative for dizziness or headache.  No other specific complaints in a complete review of systems (except as listed in HPI above).  Objective  Virtual encounter, vitals not obtained.  There is no height or weight on file to calculate BMI.  Physical Exam  Pulmonary/Chest: Effort normal. No respiratory distress. Speaking in complete sentences Neurological: Pt is alert and oriented to person, place, and time. Speech is normal Psychiatric: Patient has a normal mood and affect. behavior is normal. Judgment and thought content normal.  No results found for this or any previous visit (from the past 72 hour(s)).  PHQ2/9: Depression screen [facility] 03/18/2019 07/31/2018 05/22/2018 05/01/2018 03/10/2018  Decreased Interest 0 0 0 1 0  Down, Depressed, Hopeless 0 0 0 0 0  PHQ - 2 Score 0 0 0 1 0  Altered sleeping 0 0 1 0 0  Tired, decreased energy 0 0 1 3 0  Change in appetite 0 0 0 0 0  Feeling bad or failure about yourself  0 0 0 0 0  Trouble concentrating 0 0 0 0 0  Moving slowly or fidgety/restless 0 0 0 0 0  Suicidal thoughts 0 0 0 0 0  PHQ-9 Score 0 0 2 4 0  Difficult doing work/chores Not difficult at all Not difficult at all Not difficult at all Not difficult at all Not difficult at all   PHQ-2/9 Result is negative.    Fall Risk: Fall Risk  03/18/2019 07/31/2018 05/22/2018 05/01/2018 03/10/2018  Falls in the past year? 0 0 0 0 0  Number falls in past yr: 0 0 0 0 0  Injury with Fall? 0 0 0 0 0  Follow up Falls evaluation completed - Falls evaluation  completed - -    Assessment & Plan  1. Postpartum anxiety - Doing well off of Lexapro, will call back if needing to restart.  2. Other eczema - desonide (DESOWEN) 0.05 % lotion; Apply twice daily in thin layer to the outer aspect of the ear canal only - do not apply internally.  Dispense: 59 mL; Refill: 0 - triamcinolone cream (KENALOG) 0.1 %; Apply 1 application topically 2 (two) times daily.  Dispense: 30 g; Refill: 1   I discussed the assessment and treatment plan with the patient. The patient was provided an opportunity to ask questions and all were answered. The patient agreed with the plan and demonstrated an understanding of the instructions.   The patient was advised to call back or seek an in-person evaluation if the symptoms worsen or if the condition fails to improve as anticipated.  I provided 11 minutes of non-face-to-face time during this encounter.  Doren Custard, FNP

## 2019-03-21 ENCOUNTER — Other Ambulatory Visit: Payer: Self-pay | Admitting: Family Medicine

## 2019-03-21 DIAGNOSIS — F418 Other specified anxiety disorders: Secondary | ICD-10-CM

## 2019-05-31 ENCOUNTER — Encounter: Payer: Self-pay | Admitting: Family Medicine

## 2019-10-19 ENCOUNTER — Telehealth (INDEPENDENT_AMBULATORY_CARE_PROVIDER_SITE_OTHER): Payer: BC Managed Care – PPO | Admitting: Internal Medicine

## 2019-10-19 ENCOUNTER — Encounter: Payer: Self-pay | Admitting: Internal Medicine

## 2019-10-19 ENCOUNTER — Other Ambulatory Visit: Payer: Self-pay

## 2019-10-19 VITALS — Ht 63.0 in | Wt 129.0 lb

## 2019-10-19 DIAGNOSIS — J0100 Acute maxillary sinusitis, unspecified: Secondary | ICD-10-CM | POA: Diagnosis not present

## 2019-10-19 MED ORDER — AMOXICILLIN-POT CLAVULANATE 875-125 MG PO TABS: 1.0000 | ORAL_TABLET | Freq: Two times a day (BID) | ORAL | 0 refills | Status: DC

## 2019-10-19 MED ORDER — FLUTICASONE PROPIONATE 50 MCG/ACT NA SUSP: 2.0000 | Freq: Every day | NASAL | 1 refills | Status: DC

## 2019-10-19 NOTE — Progress Notes (Signed)
Name: Melissa Christensen   MRN: [id number]    DOB: Jan 04, 1991   Date:10/19/2019       Progress Note  Subjective  Chief Complaint  Chief Complaint  Patient presents with  . Sinus Problem    has had a cold for about 2 weeks now, right side of face, cheek is sore, lymph nodes on right side of neck are swollen.    I connected with  [name] on 10/19/19 at  9:40 AM EDT by telephone and verified that I am speaking with the correct person using two identifiers.  I discussed the limitations, risks, security and privacy concerns of performing an evaluation and management service by telephone and the availability of in person appointments. Staff also discussed with the patient that there may be a patient responsible charge related to this service. Patient Location: Home Provider Location: [facility] Additional Individuals present: none  HPI Patient is a 29 year old patient of [name] Last visit with her was in December 2020 She presents today for a televisit with sinus pressure/headache. At a wedding June 19, flower girl at the wedding was sick, daughter then got sick, then she got sick. Daughter tested for Covid and was negative.  She notes her cough is better, although has had increased pain in her sinuses persisting.  Symptoms started over 10 days ago  Denies cough, No marked SOB No fever, feeling feverish no sore throat.  Notes some soreness in the right neck area, and may have some swollen glands noted. +mild congestion, + PND, more last week, still persists on right nostril with increased pain on right side, feels into teeth, and into ear on right, mucus green and blood tinged at times no loss of smell, loss of taste no N/V No muscle aches No marked loose stools/diarrhea No CP, passing out episodes  Comorbid conditions reviewed No asthma/COPD hx,  No h/o DM, heart disease, CKD,  Not have vaccine for Covid  Has tried Dayquil/Nyquil yesterday, Advil cold and sinus last week    Patient Active Problem List   Diagnosis Date Noted  . 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 Eczema 03/10/2018    Past Surgical History:  Procedure Laterality Date  . NO PAST SURGERIES      Family History  Problem Relation Age of Onset  . Hypertension Mother   . Healthy Father   . Healthy Sister   . Dementia Maternal Grandmother   . Emphysema Maternal Grandfather 7  . Diabetes Paternal Grandmother   . Heart attack Paternal Grandfather        In his 66's  . Healthy Sister     Social History   Tobacco Use  . Smoking status: Never Smoker  . Smokeless tobacco: Never Used  Substance Use Topics  . Alcohol use: Never     Current Outpatient Medications:  .  desonide (DESOWEN) 0.05 % lotion, Apply twice daily in thin layer to the outer aspect of the ear canal only - do not apply internally., Disp: 59 mL, Rfl: 0 .  LOW-OGESTREL 0.3-30 MG-MCG tablet, , Disp: , Rfl:  .  triamcinolone cream (KENALOG) 0.1 %, Apply 1 application topically 2 (two) times daily., Disp: 30 g, Rfl: 1  No Known Allergies  With staff assistance, above reviewed with the patient today.  ROS: As per HPI, otherwise no specific complaints on a limited and focused system review   Objective  Virtual encounter, vitals not obtained.  Body mass index is 22.85 kg/m.  Physical Exam   Appears  in NAD via conversation, very pleasant Breathing: No obvious respiratory distress. Speaking in complete sentences Neurological: Pt is alert and oriented, Speech is normal Psychiatric: Patient has a normal mood and affect. Judgment and thought content normal.   No results found for this or any previous visit (from the past 72 hour(s)).  PHQ2/9: Depression screen [facility] 2/9 10/19/2019 03/18/2019 07/31/2018 05/22/2018 05/01/2018  Decreased Interest 0 0 0 0 1  Down, Depressed, Hopeless 0 0 0 0 0  PHQ - 2 Score 0 0 0 0 1  Altered sleeping 0 0 0 1 0  Tired, decreased energy 0 0 0 1 3  Change in appetite 0 0 0 0 0  Feeling bad  or failure about yourself  0 0 0 0 0  Trouble concentrating 0 0 0 0 0  Moving slowly or fidgety/restless 0 0 0 0 0  Suicidal thoughts 0 0 0 0 0  PHQ-9 Score 0 0 0 2 4  Difficult doing work/chores Not difficult at all Not difficult at all Not difficult at all Not difficult at all Not difficult at all   PHQ-2/9 Result reviewed  Fall Risk: Fall Risk  10/19/2019 03/18/2019 07/31/2018 05/22/2018 05/01/2018  Falls in the past year? 0 0 0 0 0  Number falls in past yr: 0 0 0 0 0  Injury with Fall? 0 0 0 0 0  Follow up - Falls evaluation completed - Falls evaluation completed -     Assessment & Plan  1. Acute non-recurrent maxillary sinusitis Discussed with patient that likelihood this is infectious, and could be a viral upper respiratory infection as the source.  Concerned with her increased sinus pains, feeling it into her teeth, into her cheek area, or frank sinusitis, and symptoms have been present now for over 10 days, with the pain component increasing. Felt best to add in Augmentin product twice daily, to take for 1 week. Also recommended a Flonase product, can use twice daily for the first 3 days, then use once daily until symptoms much improved. Also can continue ibuprofen products as needed for the pain.  Noted the Sudafed product can often increase the discomfort with packing it in in the sinuses, probably best to use the plain ibuprofen and not the Advil Cold and Sinus product presently Also recommended rest and fluid increase. Discussed how Covid is always in our differential presently, although I agree it does not seem like a Covid illness, although noted concerns that she has not been vaccinated. If symptoms not improving or more problematic despite the above, she is to follow-up, and recommended obtaining a Covid test as well.  She was understanding of that.  - amoxicillin-clavulanate (AUGMENTIN) 875-125 MG tablet; Take 1 tablet by mouth 2 (two) times daily.  Dispense: 14 tablet; Refill:  0 - fluticasone (FLONASE) 50 MCG/ACT nasal spray; Place 2 sprays into both nostrils daily. Can use twice daily for first 3 days.  Dispense: 16 g; Refill: 1  Await her response to the above.  I discussed the assessment and treatment plan with the patient. The patient was provided an opportunity to ask questions and all were answered. The patient agreed with the plan and demonstrated an understanding of the instructions.   The patient was advised to call back or seek an in-person evaluation if the symptoms worsen or if the condition fails to improve as anticipated.  I provided 15 minutes of non-face-to-face time during this encounter that included discussing at length patient's sx/history, pertinent pmhx, medications, treatment  and follow up plan. This time also included the necessary documentation, orders, and chart review.  Jamelle Haring, MD

## 2020-02-15 ENCOUNTER — Telehealth (INDEPENDENT_AMBULATORY_CARE_PROVIDER_SITE_OTHER): Payer: BC Managed Care – PPO | Admitting: Internal Medicine

## 2020-02-15 ENCOUNTER — Encounter: Payer: Self-pay | Admitting: Internal Medicine

## 2020-02-15 ENCOUNTER — Other Ambulatory Visit: Payer: Self-pay

## 2020-02-15 DIAGNOSIS — R52 Pain, unspecified: Secondary | ICD-10-CM

## 2020-02-15 DIAGNOSIS — Z3A01 Less than 8 weeks gestation of pregnancy: Secondary | ICD-10-CM | POA: Diagnosis not present

## 2020-02-15 DIAGNOSIS — R519 Headache, unspecified: Secondary | ICD-10-CM | POA: Diagnosis not present

## 2020-02-15 NOTE — Progress Notes (Signed)
Name: Melissa Christensen   MRN: [id number]    DOB: [birth year]   Date:02/15/2020       Progress Note  Subjective  Chief Complaint  Chief Complaint  Patient presents with  . Generalized Body Aches  . Headache    I connected with  [name] on 02/15/20 at  2:40 PM EDT by telephone and verified that I am speaking with the correct person using two identifiers.  I discussed the limitations, risks, security and privacy concerns of performing an evaluation and management service by telephone and the availability of in person appointments. The patient expressed understanding and agreed to proceed. Staff also discussed with the patient that there may be a patient responsible charge related to this service. Patient Location: Home Provider Location: [facility] Additional Individuals present: none  HPI  Patient is a 29 year old female former patient of Melissa Christensen I did have a prior visit with her by telephone 10/19/2019 Follows up today with body aches and headache She is currently approximately [redacted] weeks pregnant.  Not have Covid vaccine  Woke up this morning not feeling so hot. + HA, + backache in the flank areas, dull ache, also some leg achiness No burning with urination, + frequency but drinking a lot more water, no hematuria, no pressure over the bladder. Not cloudy or smelly urine + very slight cough, like a tickle in the throat as she described No marked SOB No fever, temp 99.9 this am, not feeling feverish No sore throat.  No mild congestion, PND No loss of smell, loss of taste No N/V Back and legs achy No marked loose stools/diarrhea No CP, confusion, passing out episodes She heard back from OB and told could take tylenol, and helped some after taking    Patient Active Problem List   Diagnosis Date Noted  . 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 Eczema 03/10/2018    Past Surgical History:  Procedure Laterality Date  . NO PAST SURGERIES      Family History  Problem Relation Age  of Onset  . Hypertension Mother   . Healthy Father   . Healthy Sister   . Dementia Maternal Grandmother   . Emphysema Maternal Grandfather 59  . Diabetes Paternal Grandmother   . Heart attack Paternal Grandfather        In his 35's  . Healthy Sister     Social History   Tobacco Use  . Smoking status: Never Smoker  . Smokeless tobacco: Never Used  Substance Use Topics  . Alcohol use: Never     Current Outpatient Medications:  .  desonide (DESOWEN) 0.05 % lotion, Apply twice daily in thin layer to the outer aspect of the ear canal only - do not apply internally., Disp: 59 mL, Rfl: 0 .  fluticasone (FLONASE) 50 MCG/ACT nasal spray, Place 2 sprays into both nostrils daily. Can use twice daily for first 3 days., Disp: 16 g, Rfl: 1 .  triamcinolone cream (KENALOG) 0.1 %, Apply 1 application topically 2 (two) times daily., Disp: 30 g, Rfl: 1  No Known Allergies  With staff assistance, above reviewed with the patient today.  ROS: As per HPI, otherwise no specific complaints on a limited and focused system review   Objective  Virtual encounter, vitals not obtained.  There is no height or weight on file to calculate BMI.  Physical Exam   Appears in NAD via conversation, pleasant Breathing: No obvious respiratory distress. Speaking in complete sentences Neurological: Pt is alert and oriented, Speech is  normal Psychiatric: Patient has a normal mood and affect, behavior is normal. Judgment and thought content normal.   No results found for this or any previous visit (from the past 72 hour(s)).  PHQ2/9: Depression screen [facility] 2/9 02/15/2020 10/19/2019 03/18/2019 07/31/2018 05/22/2018  Decreased Interest 0 0 0 0 0  Down, Depressed, Hopeless 0 0 0 0 0  PHQ - 2 Score 0 0 0 0 0  Altered sleeping - 0 0 0 1  Tired, decreased energy - 0 0 0 1  Change in appetite - 0 0 0 0  Feeling bad or failure about yourself  - 0 0 0 0  Trouble concentrating - 0 0 0 0  Moving slowly or fidgety/restless  - 0 0 0 0  Suicidal thoughts - 0 0 0 0  PHQ-9 Score - 0 0 0 2  Difficult doing work/chores - Not difficult at all Not difficult at all Not difficult at all Not difficult at all   PHQ-2/9 Result reviewed  Fall Risk: Fall Risk  02/15/2020 10/19/2019 03/18/2019 07/31/2018 05/22/2018  Falls in the past year? 0 0 0 0 0  Number falls in past yr: 0 0 0 0 0  Injury with Fall? 0 0 0 0 0  Follow up - - Falls evaluation completed - Falls evaluation completed     Assessment & Plan 1. Aches 2. Acute nonintractable headache, unspecified headache type 3.  Pregnancy Discussed with patient that this could be very early in an infectious process like a viral syndrome, and most importantly, monitoring is best presently. Discussed the potential of Covid, and do feel if her symptoms are increasing over the next 24 to 48 hours, especially of increased cough, having low-grade temps or fevers, more drainage and achiness, do feel getting a Covid test is indicated.  Did note she can call her OB/GYN at that time to get their opinion as well, and assess if agree with that recommendation. Emphasized continuing symptomatic measures, without a lot of medications presently, especially noting she is in the first 12 weeks of her pregnancy.  Recommended Tylenol products as needed, also staying well-hydrated, rest, and recommended warm products like tea and adding honey can also be beneficial. Noted Robitussin and Mucinex products are in general safe during pregnancy, although would not rush to using this, especially noting it is the first 12 weeks and tried some of the other measures to help with symptoms.  I also noted having a low yield to send the urine off to rule out a UTI, although the frequency is more than likely related to increase fluid intake, and she has no dysuria, blood in the urine, suprapubic pressure, or foul-smelling or cloudy urine.  If any concerns arise over the next 24 to 48 hours as we discussed, she should  call the office and come to leave a urine sample to send off for a urinalysis and culture.  Await her response over time, and can follow-up if symptoms not improving or more problematic or other concerns arise.  I discussed the assessment and treatment plan with the patient. The patient was provided an opportunity to ask questions and all were answered. The patient agreed with the plan and demonstrated an understanding of the instructions..   The patient was advised to call back or seek an in-person evaluation if the symptoms worsen or if the condition fails to improve as anticipated.  I provided 15 minutes of non-face-to-face time during this encounter that included discussing at length patient's sx/history, pertinent  pmhx, medications, treatment and follow up plan. This time also included the necessary documentation, orders, and chart review.  Jamelle Haring, MD

## 2023-01-02 ENCOUNTER — Ambulatory Visit: Payer: BC Managed Care – PPO | Admitting: Nurse Practitioner

## 2023-01-02 ENCOUNTER — Encounter: Payer: Self-pay | Admitting: Nurse Practitioner

## 2023-01-02 ENCOUNTER — Other Ambulatory Visit (HOSPITAL_COMMUNITY)
Admission: RE | Admit: 2023-01-02 | Discharge: 2023-01-02 | Disposition: A | Discharge status: Home / Self Care | Payer: BC Managed Care – PPO | Source: Ambulatory Visit | Attending: Nurse Practitioner | Admitting: Nurse Practitioner

## 2023-01-02 ENCOUNTER — Other Ambulatory Visit: Payer: Self-pay

## 2023-01-02 VITALS — BP 118/72 | HR 86 | Temp 98.7°F | Resp 16 | Ht 62.0 in | Wt 140.6 lb

## 2023-01-02 DIAGNOSIS — Z1159 Encounter for screening for other viral diseases: Secondary | ICD-10-CM

## 2023-01-02 DIAGNOSIS — N898 Other specified noninflammatory disorders of vagina: Secondary | ICD-10-CM

## 2023-01-02 DIAGNOSIS — F419 Anxiety disorder, unspecified: Secondary | ICD-10-CM | POA: Insufficient documentation

## 2023-01-02 DIAGNOSIS — Z130 Encounter for screening for diseases of the blood and blood-forming organs and certain disorders involving the immune mechanism: Secondary | ICD-10-CM

## 2023-01-02 DIAGNOSIS — Z114 Encounter for screening for human immunodeficiency virus [HIV]: Secondary | ICD-10-CM

## 2023-01-02 DIAGNOSIS — Z131 Encounter for screening for diabetes mellitus: Secondary | ICD-10-CM

## 2023-01-02 DIAGNOSIS — Z13220 Encounter for screening for lipoid disorders: Secondary | ICD-10-CM

## 2023-01-02 DIAGNOSIS — Z23 Encounter for immunization: Secondary | ICD-10-CM | POA: Diagnosis not present

## 2023-01-02 MED ORDER — ESCITALOPRAM OXALATE 10 MG PO TABS: 10.0000 mg | ORAL_TABLET | Freq: Every day | ORAL | 0 refills | Status: DC

## 2023-01-02 NOTE — Assessment & Plan Note (Signed)
Restart lexapro, follow up in 4 weeks

## 2023-01-02 NOTE — Progress Notes (Signed)
BP 118/72   Pulse 86   Temp 98.7 F (37.1 C) (Oral)   Resp 16   Ht 5\' 2"  (1.575 m)   Wt 140 lb 9.6 oz (63.8 kg)   LMP 12/22/2022   SpO2 99%   BMI 25.72 kg/m    Subjective:    Patient ID: Melissa Christensen, female    DOB: March 21, 1991, 32 y.o.   MRN: [id number]  HPI: Melissa Christensen is a 32 y.o. female  Chief Complaint  Patient presents with   Anxiety   Depression   Vaginitis  Patient is new to me and has not been seen in this office since 02/15/2020.  anxiety Medication none, previously on lexapro, reports it worked well for her and she would like to restart it.  Will restart and follow up in 4 weeks.  Compliant n/a Side effects n/a PHQ9 negative GAD positive     01/02/2023    9:34 AM 02/15/2020    1:10 PM 10/19/2019    9:17 AM 03/18/2019   10:45 AM 07/31/2018    8:44 AM  Depression screen PHQ 2/9  Decreased Interest 0 0 0 0 0  Down, Depressed, Hopeless 0 0 0 0 0  PHQ - 2 Score 0 0 0 0 0  Altered sleeping 0  0 0 0  Tired, decreased energy 1  0 0 0  Change in appetite 0  0 0 0  Feeling bad or failure about yourself  0  0 0 0  Trouble concentrating 1  0 0 0  Moving slowly or fidgety/restless 0  0 0 0  Suicidal thoughts 0  0 0 0  PHQ-9 Score 2  0 0 0  Difficult doing work/chores Not difficult at all  Not difficult at all Not difficult at all Not difficult at all       01/02/2023    9:34 AM 07/31/2018    8:43 AM 05/22/2018    2:22 PM 05/01/2018    1:48 PM  GAD 7 : Generalized Anxiety Score  Nervous, Anxious, on Edge 2 0 2 3  Control/stop worrying 2 0 2 3  Worry too much - different things 2 0 1 3  Trouble relaxing 2 0 1 1  Restless 0 0 0 0  Easily annoyed or irritable 3 0 2 3  Afraid - awful might happen 3 0 2 3  Total GAD 7 Score 14 0 10 16  Anxiety Difficulty Somewhat difficult Not difficult at all Not difficult at all Extremely difficult   VAGINAL itching/burning Duration: Sunday Discharge description:  none   Pruritus: yes Dysuria: no Malodorous: no Urinary  frequency: no Fevers: no Abdominal pain: no  Sexual activity: monogamous History of sexually transmitted diseases: no Recent antibiotic use: no Treatments attempted: monistat tried on Monday, slightly better but will get vaginal swab   Relevant past medical, surgical, family and social history reviewed and updated as indicated. Interim medical history since our last visit reviewed. Allergies and medications reviewed and updated.  Review of Systems  Constitutional: Negative for fever or weight change.  Respiratory: Negative for cough and shortness of breath.   Cardiovascular: Negative for chest pain or palpitations.  Gastrointestinal: Negative for abdominal pain, no bowel changes.  Musculoskeletal: Negative for gait problem or joint swelling.  Skin: Negative for rash.  Neurological: Negative for dizziness or headache.  No other specific complaints in a complete review of systems (except as listed in HPI above).      Objective:    BP  118/72   Pulse 86   Temp 98.7 F (37.1 C) (Oral)   Resp 16   Ht 5\' 2"  (1.575 m)   Wt 140 lb 9.6 oz (63.8 kg)   LMP 12/22/2022   SpO2 99%   BMI 25.72 kg/m   Wt Readings from Last 3 Encounters:  01/02/23 140 lb 9.6 oz (63.8 kg)  10/19/19 129 lb (58.5 kg)  05/22/18 135 lb 1.6 oz (61.3 kg)    Physical Exam  Constitutional: Patient appears well-developed and well-nourished. Overweight No distress.  HEENT: head atraumatic, normocephalic, pupils equal and reactive to light, neck supple, throat within normal limits Cardiovascular: Normal rate, regular rhythm and normal heart sounds.  No murmur heard. No BLE edema. Pulmonary/Chest: Effort normal and breath sounds normal. No respiratory distress. Abdominal: Soft.  There is no tenderness. Psychiatric: Patient has a normal mood and affect. behavior is normal. Judgment and thought content normal.  Results for orders placed or performed in visit on 05/01/18  TSH  Result Value Ref Range   TSH 1.63  mIU/L  COMPLETE METABOLIC PANEL WITH GFR  Result Value Ref Range   Glucose, Bld 92 65 - 99 mg/dL   BUN 13 7 - 25 mg/dL   Creat 1.61 0.96 - 0.45 mg/dL   GFR, Est Non African American 101 > OR = 60 mL/min/1.58m2   GFR, Est African American 117 > OR = 60 mL/min/1.3m2   BUN/Creatinine Ratio NOT APPLICABLE 6 - 22 (calc)   Sodium 139 135 - 146 mmol/L   Potassium 4.5 3.5 - 5.3 mmol/L   Chloride 104 98 - 110 mmol/L   CO2 25 20 - 32 mmol/L   Calcium 9.9 8.6 - 10.2 mg/dL   Total Protein 7.7 6.1 - 8.1 g/dL   Albumin 4.3 3.6 - 5.1 g/dL   Globulin 3.4 1.9 - 3.7 g/dL (calc)   AG Ratio 1.3 1.0 - 2.5 (calc)   Total Bilirubin 0.4 0.2 - 1.2 mg/dL   Alkaline phosphatase (APISO) 48 33 - 115 U/L   AST 13 10 - 30 U/L   ALT 10 6 - 29 U/L  CBC w/Diff/Platelet  Result Value Ref Range   WBC 7.3 3.8 - 10.8 Thousand/uL   RBC 4.95 3.80 - 5.10 Million/uL   Hemoglobin 13.2 11.7 - 15.5 g/dL   HCT 40.9 81.1 - 91.4 %   MCV 81.4 80.0 - 100.0 fL   MCH 26.7 (L) 27.0 - 33.0 pg   MCHC 32.8 32.0 - 36.0 g/dL   RDW 78.2 95.6 - 21.3 %   Platelets 391 140 - 400 Thousand/uL   MPV 10.5 7.5 - 12.5 fL   Neutro Abs 3,745 1,500 - 7,800 cells/uL   Lymphs Abs 2,832 850 - 3,900 cells/uL   Absolute Monocytes 599 200 - 950 cells/uL   Eosinophils Absolute 51 15 - 500 cells/uL   Basophils Absolute 73 0 - 200 cells/uL   Neutrophils Relative % 51.3 %   Total Lymphocyte 38.8 %   Monocytes Relative 8.2 %   Eosinophils Relative 0.7 %   Basophils Relative 1.0 %      Assessment & Plan:   Problem List Items Addressed This Visit       Other   Anxiety - Primary    Restart lexapro, follow up in 4 weeks      Relevant Medications   escitalopram (LEXAPRO) 10 MG tablet   Other Relevant Orders   TSH   Other Visit Diagnoses     Screening for diabetes mellitus  Relevant Orders   COMPLETE METABOLIC PANEL WITH GFR   Hemoglobin A1c   Screening for deficiency anemia       Relevant Orders   CBC with Differential/Platelet    Screening for cholesterol level       Relevant Orders   Lipid panel   Encounter for hepatitis C screening test for low risk patient       Relevant Orders   Hepatitis C antibody   Screening for HIV without presence of risk factors       Relevant Orders   HIV Antibody (routine testing w rflx)   Need for influenza vaccination       Relevant Orders   Flu vaccine trivalent PF, 6mos and older(Flulaval,Afluria,Fluarix,Fluzone) (Completed)   Vaginal itching       vaginal swab collected   Relevant Orders   Cervicovaginal ancillary only        Follow up plan: Return in about 4 weeks (around 01/30/2023) for follow up.

## 2023-01-03 ENCOUNTER — Encounter: Payer: Self-pay | Admitting: Nurse Practitioner

## 2023-01-03 LAB — CBC WITH DIFFERENTIAL/PLATELET
Absolute Monocytes: 455 cells/uL (ref 200–950)
Basophils Absolute: 64 cells/uL (ref 0–200)
Basophils Relative: 0.7 %
Eosinophils Absolute: 64 cells/uL (ref 15–500)
Eosinophils Relative: 0.7 %
HCT: 40.9 % (ref 35.0–45.0)
Hemoglobin: 13.5 g/dL (ref 11.7–15.5)
Lymphs Abs: 2075 cells/uL (ref 850–3900)
MCH: 29.2 pg (ref 27.0–33.0)
MCHC: 33 g/dL (ref 32.0–36.0)
MCV: 88.3 fL (ref 80.0–100.0)
MPV: 9.9 fL (ref 7.5–12.5)
Monocytes Relative: 5 %
Neutro Abs: 6443 cells/uL (ref 1500–7800)
Neutrophils Relative %: 70.8 %
Platelets: 357 10*3/uL (ref 140–400)
RBC: 4.63 10*6/uL (ref 3.80–5.10)
RDW: 12.3 % (ref 11.0–15.0)
Total Lymphocyte: 22.8 %
WBC: 9.1 10*3/uL (ref 3.8–10.8)

## 2023-01-03 LAB — COMPLETE METABOLIC PANEL WITH GFR
AG Ratio: 1.3 (calc) (ref 1.0–2.5)
ALT: 10 U/L (ref 6–29)
AST: 11 U/L (ref 10–30)
Albumin: 4.3 g/dL (ref 3.6–5.1)
Alkaline phosphatase (APISO): 33 U/L (ref 31–125)
BUN: 15 mg/dL (ref 7–25)
CO2: 25 mmol/L (ref 20–32)
Calcium: 9.4 mg/dL (ref 8.6–10.2)
Chloride: 105 mmol/L (ref 98–110)
Creat: 0.64 mg/dL (ref 0.50–0.97)
Globulin: 3.2 g/dL (calc) (ref 1.9–3.7)
Glucose, Bld: 84 mg/dL (ref 65–99)
Potassium: 4.7 mmol/L (ref 3.5–5.3)
Sodium: 138 mmol/L (ref 135–146)
Total Bilirubin: 0.3 mg/dL (ref 0.2–1.2)
Total Protein: 7.5 g/dL (ref 6.1–8.1)
eGFR: 120 mL/min/{1.73_m2} (ref >=60)

## 2023-01-03 LAB — LIPID PANEL
Cholesterol: 232 mg/dL — ABNORMAL HIGH (ref <200)
HDL: 53 mg/dL (ref >=50)
LDL Cholesterol (Calc): 149 mg/dL (calc) — ABNORMAL HIGH
Non-HDL Cholesterol (Calc): 179 mg/dL (calc) — ABNORMAL HIGH (ref <130)
Total CHOL/HDL Ratio: 4.4 (calc) (ref <5.0)
Triglycerides: 160 mg/dL — ABNORMAL HIGH (ref <150)

## 2023-01-03 LAB — CERVICOVAGINAL ANCILLARY ONLY
Bacterial Vaginitis (gardnerella): NEGATIVE
Candida Glabrata: NEGATIVE
Candida Vaginitis: NEGATIVE
Chlamydia: NEGATIVE
Comment: NEGATIVE
Comment: NEGATIVE
Comment: NEGATIVE
Comment: NEGATIVE
Comment: NEGATIVE
Comment: NORMAL
Neisseria Gonorrhea: NEGATIVE
Trichomonas: NEGATIVE

## 2023-01-03 LAB — HEPATITIS C ANTIBODY: Hepatitis C Ab: NONREACTIVE

## 2023-01-03 LAB — TSH: TSH: 1.65 mIU/L

## 2023-01-03 LAB — HIV ANTIBODY (ROUTINE TESTING W REFLEX): HIV 1&2 Ab, 4th Generation: NONREACTIVE

## 2023-01-03 LAB — HEMOGLOBIN A1C
Hgb A1c MFr Bld: 5.3 % of total Hgb (ref <5.7)
Mean Plasma Glucose: 105 mg/dL
eAG (mmol/L): 5.8 mmol/L

## 2023-01-29 ENCOUNTER — Other Ambulatory Visit: Payer: Self-pay | Admitting: Nurse Practitioner

## 2023-01-29 DIAGNOSIS — F419 Anxiety disorder, unspecified: Secondary | ICD-10-CM

## 2023-01-29 NOTE — Telephone Encounter (Signed)
Requested Prescriptions  Pending Prescriptions Disp Refills   escitalopram (LEXAPRO) 10 MG tablet [Pharmacy Med Name: ESCITALOPRAM 10 MG TABLET] 30 tablet 0    Sig: TAKE 1 TABLET BY MOUTH DAILY     Psychiatry:  Antidepressants - SSRI Passed - 01/29/2023  6:21 AM      Passed - Valid encounter within last 6 months    Recent Outpatient Visits           3 weeks ago Anxiety   [facility] Berniece Salines, FNP   2 years ago Aches   [facility] Welford Roche D, MD   3 years ago Acute non-recurrent maxillary sinusitis   [facility] Jamelle Haring, MD   3 years ago Postpartum anxiety   [facility] Doren Custard, FNP   4 years ago Acute vaginitis   [facility] Doren Custard, FNP       Future Appointments             In 5 days Zane Herald, Rudolpho Sevin, FNP [facility]

## 2023-02-03 ENCOUNTER — Ambulatory Visit: Payer: BC Managed Care – PPO | Admitting: Nurse Practitioner

## 2023-02-03 NOTE — Progress Notes (Deleted)
There were no vitals taken for this visit.   Subjective:    Patient ID: Melissa Christensen, female    DOB: Jun 23, 1990, 32 y.o.   MRN: [id number]  HPI: Melissa Christensen is a 32 y.o. female  No chief complaint on file.   Anxiety-restarted lexapro last visit.  Patient here for 4 week follow up.  Medication lexapro 10 mg daily Compliant yes Side effects no PHQ9 *** GAD ***      01/02/2023    9:34 AM 02/15/2020    1:10 PM 10/19/2019    9:17 AM 03/18/2019   10:45 AM 07/31/2018    8:44 AM  Depression screen PHQ 2/9  Decreased Interest 0 0 0 0 0  Down, Depressed, Hopeless 0 0 0 0 0  PHQ - 2 Score 0 0 0 0 0  Altered sleeping 0  0 0 0  Tired, decreased energy 1  0 0 0  Change in appetite 0  0 0 0  Feeling bad or failure about yourself  0  0 0 0  Trouble concentrating 1  0 0 0  Moving slowly or fidgety/restless 0  0 0 0  Suicidal thoughts 0  0 0 0  PHQ-9 Score 2  0 0 0  Difficult doing work/chores Not difficult at all  Not difficult at all Not difficult at all Not difficult at all       01/02/2023    9:34 AM 07/31/2018    8:43 AM 05/22/2018    2:22 PM 05/01/2018    1:48 PM  GAD 7 : Generalized Anxiety Score  Nervous, Anxious, on Edge 2 0 2 3  Control/stop worrying 2 0 2 3  Worry too much - different things 2 0 1 3  Trouble relaxing 2 0 1 1  Restless 0 0 0 0  Easily annoyed or irritable 3 0 2 3  Afraid - awful might happen 3 0 2 3  Total GAD 7 Score 14 0 10 16  Anxiety Difficulty Somewhat difficult Not difficult at all Not difficult at all Extremely difficult    Relevant past medical, surgical, family and social history reviewed and updated as indicated. Interim medical history since our last visit reviewed. Allergies and medications reviewed and updated.  Review of Systems  Constitutional: Negative for fever or weight change.  Respiratory: Negative for cough and shortness of breath.   Cardiovascular: Negative for chest pain or palpitations.  Gastrointestinal: Negative for  abdominal pain, no bowel changes.  Musculoskeletal: Negative for gait problem or joint swelling.  Skin: Negative for rash.  Neurological: Negative for dizziness or headache.  No other specific complaints in a complete review of systems (except as listed in HPI above).      Objective:    There were no vitals taken for this visit.  Wt Readings from Last 3 Encounters:  01/02/23 140 lb 9.6 oz (63.8 kg)  10/19/19 129 lb (58.5 kg)  05/22/18 135 lb 1.6 oz (61.3 kg)    Physical Exam  Constitutional: Patient appears well-developed and well-nourished. Overweight No distress.  HEENT: head atraumatic, normocephalic, pupils equal and reactive to light, neck supple, throat within normal limits Cardiovascular: Normal rate, regular rhythm and normal heart sounds.  No murmur heard. No BLE edema. Pulmonary/Chest: Effort normal and breath sounds normal. No respiratory distress. Abdominal: Soft.  There is no tenderness. Psychiatric: Patient has a normal mood and affect. behavior is normal. Judgment and thought content normal.  Results for orders placed or performed in visit on  01/02/23  CBC with Differential/Platelet  Result Value Ref Range   WBC 9.1 3.8 - 10.8 Thousand/uL   RBC 4.63 3.80 - 5.10 Million/uL   Hemoglobin 13.5 11.7 - 15.5 g/dL   HCT 32.4 40.1 - 02.7 %   MCV 88.3 80.0 - 100.0 fL   MCH 29.2 27.0 - 33.0 pg   MCHC 33.0 32.0 - 36.0 g/dL   RDW 25.3 66.4 - 40.3 %   Platelets 357 140 - 400 Thousand/uL   MPV 9.9 7.5 - 12.5 fL   Neutro Abs 6,443 1,500 - 7,800 cells/uL   Lymphs Abs 2,075 850 - 3,900 cells/uL   Absolute Monocytes 455 200 - 950 cells/uL   Eosinophils Absolute 64 15 - 500 cells/uL   Basophils Absolute 64 0 - 200 cells/uL   Neutrophils Relative % 70.8 %   Total Lymphocyte 22.8 %   Monocytes Relative 5.0 %   Eosinophils Relative 0.7 %   Basophils Relative 0.7 %  COMPLETE METABOLIC PANEL WITH GFR  Result Value Ref Range   Glucose, Bld 84 65 - 99 mg/dL   BUN 15 7 - 25 mg/dL    Creat 4.74 2.59 - 5.63 mg/dL   eGFR 875 > OR = 60 IE/PPI/9.51O8   BUN/Creatinine Ratio SEE NOTE: 6 - 22 (calc)   Sodium 138 135 - 146 mmol/L   Potassium 4.7 3.5 - 5.3 mmol/L   Chloride 105 98 - 110 mmol/L   CO2 25 20 - 32 mmol/L   Calcium 9.4 8.6 - 10.2 mg/dL   Total Protein 7.5 6.1 - 8.1 g/dL   Albumin 4.3 3.6 - 5.1 g/dL   Globulin 3.2 1.9 - 3.7 g/dL (calc)   AG Ratio 1.3 1.0 - 2.5 (calc)   Total Bilirubin 0.3 0.2 - 1.2 mg/dL   Alkaline phosphatase (APISO) 33 31 - 125 U/L   AST 11 10 - 30 U/L   ALT 10 6 - 29 U/L  Lipid panel  Result Value Ref Range   Cholesterol 232 (H) <200 mg/dL   HDL 53 > OR = 50 mg/dL   Triglycerides 416 (H) <150 mg/dL   LDL Cholesterol (Calc) 149 (H) mg/dL (calc)   Total CHOL/HDL Ratio 4.4 <5.0 (calc)   Non-HDL Cholesterol (Calc) 179 (H) <130 mg/dL (calc)  Hemoglobin S0Y  Result Value Ref Range   Hgb A1c MFr Bld 5.3 <5.7 % of total Hgb   Mean Plasma Glucose 105 mg/dL   eAG (mmol/L) 5.8 mmol/L  Hepatitis C antibody  Result Value Ref Range   Hepatitis C Ab NON-REACTIVE NON-REACTIVE  HIV Antibody (routine testing w rflx)  Result Value Ref Range   HIV 1&2 Ab, 4th Generation NON-REACTIVE NON-REACTIVE  TSH  Result Value Ref Range   TSH 1.65 mIU/L  Cervicovaginal ancillary only  Result Value Ref Range   Neisseria Gonorrhea Negative    Chlamydia Negative    Trichomonas Negative    Bacterial Vaginitis (gardnerella) Negative    Candida Vaginitis Negative    Candida Glabrata Negative    Comment      Normal Reference Range Bacterial Vaginosis - Negative   Comment Normal Reference Range Candida Species - Negative    Comment Normal Reference Range Candida Galbrata - Negative    Comment Normal Reference Range Trichomonas - Negative    Comment Normal Reference Ranger Chlamydia - Negative    Comment      Normal Reference Range Neisseria Gonorrhea - Negative      Assessment & Plan:   Problem List  Items Addressed This Visit   None     Follow up  plan: No follow-ups on file.

## 2023-02-12 ENCOUNTER — Other Ambulatory Visit: Payer: Self-pay

## 2023-02-12 ENCOUNTER — Encounter: Payer: Self-pay | Admitting: Nurse Practitioner

## 2023-02-12 ENCOUNTER — Ambulatory Visit: Payer: BC Managed Care – PPO | Admitting: Nurse Practitioner

## 2023-02-12 VITALS — BP 110/83 | HR 78 | Temp 98.4°F | Resp 18 | Ht 62.0 in | Wt 143.2 lb

## 2023-02-12 DIAGNOSIS — F330 Major depressive disorder, recurrent, mild: Secondary | ICD-10-CM | POA: Diagnosis not present

## 2023-02-12 DIAGNOSIS — E782 Mixed hyperlipidemia: Secondary | ICD-10-CM | POA: Insufficient documentation

## 2023-02-12 DIAGNOSIS — F419 Anxiety disorder, unspecified: Secondary | ICD-10-CM | POA: Diagnosis not present

## 2023-02-12 MED ORDER — SERTRALINE HCL 25 MG PO TABS: 25.0000 mg | ORAL_TABLET | Freq: Every day | ORAL | 0 refills | Status: DC

## 2023-02-12 NOTE — Progress Notes (Signed)
BP 110/83   Pulse 78   Temp 98.4 F (36.9 C) (Oral)   Resp 18   Ht 5\' 2"  (1.575 m)   Wt 143 lb 3.2 oz (65 kg)   SpO2 98%   BMI 26.19 kg/m    Subjective:    Patient ID: Melissa Christensen, female    DOB: October 06, 1990, 32 y.o.   MRN: [id number]  HPI: Melissa Christensen is a 32 y.o. female  Chief Complaint  Patient presents with   Medical Management of Chronic Issues    4 week recheck Lexapro. Patient stopped taking medication    Anxiety-restarted lexapro last visit.  Patient here for 4 week follow up.  Medication lexapro 10 mg daily Compliant no she stopped taking it, she says it made her tired Side effects she reported it made her tired PHQ9 positive GAD positive She reports she does not feel depressed, she says that she does feel irritated every day.  Will trial zoloft 25 mg daily, follow up in 4 weeks.      02/12/2023    8:57 AM 01/02/2023    9:34 AM 02/15/2020    1:10 PM 10/19/2019    9:17 AM 03/18/2019   10:45 AM  Depression screen PHQ 2/9  Decreased Interest 0 0 0 0 0  Down, Depressed, Hopeless 0 0 0 0 0  PHQ - 2 Score 0 0 0 0 0  Altered sleeping 2 0  0 0  Tired, decreased energy 2 1  0 0  Change in appetite 2 0  0 0  Feeling bad or failure about yourself  0 0  0 0  Trouble concentrating 0 1  0 0  Moving slowly or fidgety/restless 0 0  0 0  Suicidal thoughts 0 0  0 0  PHQ-9 Score 6 2  0 0  Difficult doing work/chores Not difficult at all Not difficult at all  Not difficult at all Not difficult at all       02/12/2023    8:57 AM 01/02/2023    9:34 AM 07/31/2018    8:43 AM 05/22/2018    2:22 PM  GAD 7 : Generalized Anxiety Score  Nervous, Anxious, on Edge 1 2 0 2  Control/stop worrying 1 2 0 2  Worry too much - different things 1 2 0 1  Trouble relaxing 1 2 0 1  Restless 0 0 0 0  Easily annoyed or irritable 2 3 0 2  Afraid - awful might happen 0 3 0 2  Total GAD 7 Score 6 14 0 10  Anxiety Difficulty Not difficult at all Somewhat difficult Not difficult at all Not  difficult at all   HLD:  -Medications: none -discussed lifestyle modifications -Last lipid panel:  Lipid Panel     Component Value Date/Time   CHOL 232 (H) 01/02/2023 1006   TRIG 160 (H) 01/02/2023 1006   HDL 53 01/02/2023 1006   CHOLHDL 4.4 01/02/2023 1006   LDLCALC 149 (H) 01/02/2023 1006      Relevant past medical, surgical, family and social history reviewed and updated as indicated. Interim medical history since our last visit reviewed. Allergies and medications reviewed and updated.  Review of Systems  Constitutional: Negative for fever or weight change.  Respiratory: Negative for cough and shortness of breath.   Cardiovascular: Negative for chest pain or palpitations.  Gastrointestinal: Negative for abdominal pain, no bowel changes.  Musculoskeletal: Negative for gait problem or joint swelling.  Skin: Negative for rash.  Neurological: Negative  for dizziness or headache.  No other specific complaints in a complete review of systems (except as listed in HPI above).      Objective:    BP 110/83   Pulse 78   Temp 98.4 F (36.9 C) (Oral)   Resp 18   Ht 5\' 2"  (1.575 m)   Wt 143 lb 3.2 oz (65 kg)   SpO2 98%   BMI 26.19 kg/m   Wt Readings from Last 3 Encounters:  02/12/23 143 lb 3.2 oz (65 kg)  01/02/23 140 lb 9.6 oz (63.8 kg)  10/19/19 129 lb (58.5 kg)    Physical Exam  Constitutional: Patient appears well-developed and well-nourished. Overweight No distress.  HEENT: head atraumatic, normocephalic, pupils equal and reactive to light, neck supple, throat within normal limits Cardiovascular: Normal rate, regular rhythm and normal heart sounds.  No murmur heard. No BLE edema. Pulmonary/Chest: Effort normal and breath sounds normal. No respiratory distress. Abdominal: Soft.  There is no tenderness. Psychiatric: Patient has a normal mood and affect. behavior is normal. Judgment and thought content normal.  Results for orders placed or performed in visit on 01/02/23   CBC with Differential/Platelet  Result Value Ref Range   WBC 9.1 3.8 - 10.8 Thousand/uL   RBC 4.63 3.80 - 5.10 Million/uL   Hemoglobin 13.5 11.7 - 15.5 g/dL   HCT 16.1 09.6 - 04.5 %   MCV 88.3 80.0 - 100.0 fL   MCH 29.2 27.0 - 33.0 pg   MCHC 33.0 32.0 - 36.0 g/dL   RDW 40.9 81.1 - 91.4 %   Platelets 357 140 - 400 Thousand/uL   MPV 9.9 7.5 - 12.5 fL   Neutro Abs 6,443 1,500 - 7,800 cells/uL   Lymphs Abs 2,075 850 - 3,900 cells/uL   Absolute Monocytes 455 200 - 950 cells/uL   Eosinophils Absolute 64 15 - 500 cells/uL   Basophils Absolute 64 0 - 200 cells/uL   Neutrophils Relative % 70.8 %   Total Lymphocyte 22.8 %   Monocytes Relative 5.0 %   Eosinophils Relative 0.7 %   Basophils Relative 0.7 %  COMPLETE METABOLIC PANEL WITH GFR  Result Value Ref Range   Glucose, Bld 84 65 - 99 mg/dL   BUN 15 7 - 25 mg/dL   Creat 7.82 9.56 - 2.13 mg/dL   eGFR 086 > OR = 60 VH/QIO/9.62X5   BUN/Creatinine Ratio SEE NOTE: 6 - 22 (calc)   Sodium 138 135 - 146 mmol/L   Potassium 4.7 3.5 - 5.3 mmol/L   Chloride 105 98 - 110 mmol/L   CO2 25 20 - 32 mmol/L   Calcium 9.4 8.6 - 10.2 mg/dL   Total Protein 7.5 6.1 - 8.1 g/dL   Albumin 4.3 3.6 - 5.1 g/dL   Globulin 3.2 1.9 - 3.7 g/dL (calc)   AG Ratio 1.3 1.0 - 2.5 (calc)   Total Bilirubin 0.3 0.2 - 1.2 mg/dL   Alkaline phosphatase (APISO) 33 31 - 125 U/L   AST 11 10 - 30 U/L   ALT 10 6 - 29 U/L  Lipid panel  Result Value Ref Range   Cholesterol 232 (H) <200 mg/dL   HDL 53 > OR = 50 mg/dL   Triglycerides 284 (H) <150 mg/dL   LDL Cholesterol (Calc) 149 (H) mg/dL (calc)   Total CHOL/HDL Ratio 4.4 <5.0 (calc)   Non-HDL Cholesterol (Calc) 179 (H) <130 mg/dL (calc)  Hemoglobin X3K  Result Value Ref Range   Hgb A1c MFr Bld 5.3 <5.7 %  of total Hgb   Mean Plasma Glucose 105 mg/dL   eAG (mmol/L) 5.8 mmol/L  Hepatitis C antibody  Result Value Ref Range   Hepatitis C Ab NON-REACTIVE NON-REACTIVE  HIV Antibody (routine testing w rflx)  Result Value  Ref Range   HIV 1&2 Ab, 4th Generation NON-REACTIVE NON-REACTIVE  TSH  Result Value Ref Range   TSH 1.65 mIU/L  Cervicovaginal ancillary only  Result Value Ref Range   Neisseria Gonorrhea Negative    Chlamydia Negative    Trichomonas Negative    Bacterial Vaginitis (gardnerella) Negative    Candida Vaginitis Negative    Candida Glabrata Negative    Comment      Normal Reference Range Bacterial Vaginosis - Negative   Comment Normal Reference Range Candida Species - Negative    Comment Normal Reference Range Candida Galbrata - Negative    Comment Normal Reference Range Trichomonas - Negative    Comment Normal Reference Ranger Chlamydia - Negative    Comment      Normal Reference Range Neisseria Gonorrhea - Negative      Assessment & Plan:   Problem List Items Addressed This Visit       Other   Anxiety - Primary   Relevant Medications   sertraline (ZOLOFT) 25 MG tablet   Mild episode of recurrent major depressive disorder (HCC)   Relevant Medications   sertraline (ZOLOFT) 25 MG tablet   Mixed hyperlipidemia    Work on lifestyle modifications         Follow up plan: Return in about 4 weeks (around 03/12/2023) for mood follow up, 6 months for regular follow up.

## 2023-02-12 NOTE — Assessment & Plan Note (Signed)
Work on lifestyle modifications. 

## 2023-03-09 ENCOUNTER — Other Ambulatory Visit: Payer: Self-pay | Admitting: Nurse Practitioner

## 2023-03-09 DIAGNOSIS — F330 Major depressive disorder, recurrent, mild: Secondary | ICD-10-CM

## 2023-03-09 DIAGNOSIS — F419 Anxiety disorder, unspecified: Secondary | ICD-10-CM

## 2023-03-11 NOTE — Telephone Encounter (Signed)
Requested Prescriptions  Pending Prescriptions Disp Refills   sertraline (ZOLOFT) 25 MG tablet [Pharmacy Med Name: SERTRALINE HCL 25 MG TABLET] 90 tablet 1    Sig: TAKE 1 TABLET BY MOUTH DAILY     Psychiatry:  Antidepressants - SSRI - sertraline Passed - 03/09/2023  6:51 AM      Passed - AST in normal range and within 360 days    AST  Date Value Ref Range Status  01/02/2023 11 10 - 30 U/L Final         Passed - ALT in normal range and within 360 days    ALT  Date Value Ref Range Status  01/02/2023 10 6 - 29 U/L Final         Passed - Completed PHQ-2 or PHQ-9 in the last 360 days      Passed - Valid encounter within last 6 months    Recent Outpatient Visits           3 weeks ago Anxiety   [facility] Berniece Salines, FNP   2 months ago Anxiety   [facility] Berniece Salines, FNP   3 years ago Aches   [facility] Welford Roche D, MD   3 years ago Acute non-recurrent maxillary sinusitis   [facility] Jamelle Haring, MD   3 years ago Postpartum anxiety   [facility] Doren Custard, [place]       Future Appointments             In 1 week Zane Herald, Rudolpho Sevin, FNP [facility]   In 5 months Zane Herald, Rudolpho Sevin, FNP [facility]             escitalopram (LEXAPRO) 10 MG tablet [Pharmacy Med Name: ESCITALOPRAM 10 MG TABLET] 30 tablet 0    Sig: TAKE 1 TABLET BY MOUTH DAILY     Psychiatry:  Antidepressants - SSRI Passed - 03/09/2023  6:51 AM      Passed - Completed PHQ-2 or PHQ-9 in the last 360 days      Passed - Valid encounter within last 6 months    Recent Outpatient Visits           3 weeks ago Anxiety   [facility] Berniece Salines, FNP   2 months ago Anxiety   [facility] Berniece Salines, FNP   3 years ago  Aches   [facility] Welford Roche D, MD   3 years ago Acute non-recurrent maxillary sinusitis   [facility] Jamelle Haring, MD   3 years ago Postpartum anxiety   [facility] Doren Custard, [place]       Future Appointments             In 1 week Zane Herald, Rudolpho Sevin, FNP [facility]   In 5 months Zane Herald, Rudolpho Sevin, FNP [facility]

## 2023-03-18 ENCOUNTER — Ambulatory Visit: Payer: BC Managed Care – PPO | Admitting: Nurse Practitioner

## 2023-03-18 ENCOUNTER — Encounter: Payer: Self-pay | Admitting: Nurse Practitioner

## 2023-03-18 VITALS — BP 112/82 | HR 72 | Temp 98.3°F | Resp 14 | Ht 63.0 in | Wt 143.2 lb

## 2023-03-18 DIAGNOSIS — F419 Anxiety disorder, unspecified: Secondary | ICD-10-CM

## 2023-03-18 NOTE — Progress Notes (Signed)
BP 112/82 (BP Location: Left Arm, Patient Position: Sitting, Cuff Size: Normal)   Pulse 72   Temp 98.3 F (36.8 C) (Oral)   Resp 14   Ht 5\' 3"  (1.6 m) Comment: per chart  Wt 143 lb 3.2 oz (65 kg)   SpO2 99%   BMI 25.37 kg/m    Subjective:    Patient ID: Melissa Christensen, female    DOB: 1991/02/02, 32 y.o.   MRN: [id number]  HPI: Melissa Christensen is a 32 y.o. female  Chief Complaint  Patient presents with   Follow-up    Discussed the use of AI scribe software for clinical note transcription with the patient, who gave verbal consent to proceed.  History of Present Illness   The patient, with a history of anxiety, presents for a medication follow-up. They recently switched from Lexapro to Zoloft due to side effects of fatigue and feeling 'weird.' On Zoloft 25mg  daily, they report feeling 'really good' and deny side effects. They are satisfied with their current dosage and report a significant improvement in their irritability. They also mention a history of being on Lexapro for a year after the birth of their daughter, which they eventually discontinued when they felt better.       03/18/2023    8:29 AM 02/12/2023    8:57 AM 01/02/2023    9:34 AM 02/15/2020    1:10 PM 10/19/2019    9:17 AM  Depression screen PHQ 2/9  Decreased Interest 0 0 0 0 0  Down, Depressed, Hopeless 0 0 0 0 0  PHQ - 2 Score 0 0 0 0 0  Altered sleeping 0 2 0  0  Tired, decreased energy 1 2 1   0  Change in appetite 0 2 0  0  Feeling bad or failure about yourself  0 0 0  0  Trouble concentrating 0 0 1  0  Moving slowly or fidgety/restless 0 0 0  0  Suicidal thoughts 0 0 0  0  PHQ-9 Score 1 6 2   0  Difficult doing work/chores Not difficult at all Not difficult at all Not difficult at all  Not difficult at all       03/18/2023    8:30 AM 02/12/2023    8:57 AM 01/02/2023    9:34 AM 07/31/2018    8:43 AM  GAD 7 : Generalized Anxiety Score  Nervous, Anxious, on Edge 0 1 2 0  Control/stop worrying 0 1 2 0  Worry  too much - different things 0 1 2 0  Trouble relaxing 0 1 2 0  Restless 0 0 0 0  Easily annoyed or irritable 1 2 3  0  Afraid - awful might happen 0 0 3 0  Total GAD 7 Score 1 6 14  0  Anxiety Difficulty Not difficult at all Not difficult at all Somewhat difficult Not difficult at all    Lipid Panel     Component Value Date/Time   CHOL 232 (H) 01/02/2023 1006   TRIG 160 (H) 01/02/2023 1006   HDL 53 01/02/2023 1006   CHOLHDL 4.4 01/02/2023 1006   LDLCALC 149 (H) 01/02/2023 1006      Relevant past medical, surgical, family and social history reviewed and updated as indicated. Interim medical history since our last visit reviewed. Allergies and medications reviewed and updated.  Review of Systems  Constitutional: Negative for fever or weight change.  Respiratory: Negative for cough and shortness of breath.   Cardiovascular: Negative for chest pain  or palpitations.  Gastrointestinal: Negative for abdominal pain, no bowel changes.  Musculoskeletal: Negative for gait problem or joint swelling.  Skin: Negative for rash.  Neurological: Negative for dizziness or headache.  No other specific complaints in a complete review of systems (except as listed in HPI above).      Objective:    BP 112/82 (BP Location: Left Arm, Patient Position: Sitting, Cuff Size: Normal)   Pulse 72   Temp 98.3 F (36.8 C) (Oral)   Resp 14   Ht 5\' 3"  (1.6 m) Comment: per chart  Wt 143 lb 3.2 oz (65 kg)   SpO2 99%   BMI 25.37 kg/m   Wt Readings from Last 3 Encounters:  03/18/23 143 lb 3.2 oz (65 kg)  02/12/23 143 lb 3.2 oz (65 kg)  01/02/23 140 lb 9.6 oz (63.8 kg)    Physical Exam  Constitutional: Patient appears well-developed and well-nourished. Overweight No distress.  HEENT: head atraumatic, normocephalic, pupils equal and reactive to light, neck supple, throat within normal limits Cardiovascular: Normal rate, regular rhythm and normal heart sounds.  No murmur heard. No BLE  edema. Pulmonary/Chest: Effort normal and breath sounds normal. No respiratory distress. Abdominal: Soft.  There is no tenderness. Psychiatric: Patient has a normal mood and affect. behavior is normal. Judgment and thought content normal.  Results for orders placed or performed in visit on 01/02/23  CBC with Differential/Platelet  Result Value Ref Range   WBC 9.1 3.8 - 10.8 Thousand/uL   RBC 4.63 3.80 - 5.10 Million/uL   Hemoglobin 13.5 11.7 - 15.5 g/dL   HCT 16.1 09.6 - 04.5 %   MCV 88.3 80.0 - 100.0 fL   MCH 29.2 27.0 - 33.0 pg   MCHC 33.0 32.0 - 36.0 g/dL   RDW 40.9 81.1 - 91.4 %   Platelets 357 140 - 400 Thousand/uL   MPV 9.9 7.5 - 12.5 fL   Neutro Abs 6,443 1,500 - 7,800 cells/uL   Lymphs Abs 2,075 850 - 3,900 cells/uL   Absolute Monocytes 455 200 - 950 cells/uL   Eosinophils Absolute 64 15 - 500 cells/uL   Basophils Absolute 64 0 - 200 cells/uL   Neutrophils Relative % 70.8 %   Total Lymphocyte 22.8 %   Monocytes Relative 5.0 %   Eosinophils Relative 0.7 %   Basophils Relative 0.7 %  COMPLETE METABOLIC PANEL WITH GFR  Result Value Ref Range   Glucose, Bld 84 65 - 99 mg/dL   BUN 15 7 - 25 mg/dL   Creat 7.82 9.56 - 2.13 mg/dL   eGFR 086 > OR = 60 VH/QIO/9.62X5   BUN/Creatinine Ratio SEE NOTE: 6 - 22 (calc)   Sodium 138 135 - 146 mmol/L   Potassium 4.7 3.5 - 5.3 mmol/L   Chloride 105 98 - 110 mmol/L   CO2 25 20 - 32 mmol/L   Calcium 9.4 8.6 - 10.2 mg/dL   Total Protein 7.5 6.1 - 8.1 g/dL   Albumin 4.3 3.6 - 5.1 g/dL   Globulin 3.2 1.9 - 3.7 g/dL (calc)   AG Ratio 1.3 1.0 - 2.5 (calc)   Total Bilirubin 0.3 0.2 - 1.2 mg/dL   Alkaline phosphatase (APISO) 33 31 - 125 U/L   AST 11 10 - 30 U/L   ALT 10 6 - 29 U/L  Lipid panel  Result Value Ref Range   Cholesterol 232 (H) <200 mg/dL   HDL 53 > OR = 50 mg/dL   Triglycerides 284 (H) <150 mg/dL  LDL Cholesterol (Calc) 149 (H) mg/dL (calc)   Total CHOL/HDL Ratio 4.4 <5.0 (calc)   Non-HDL Cholesterol (Calc) 179 (H) <130  mg/dL (calc)  Hemoglobin Z6X  Result Value Ref Range   Hgb A1c MFr Bld 5.3 <5.7 % of total Hgb   Mean Plasma Glucose 105 mg/dL   eAG (mmol/L) 5.8 mmol/L  Hepatitis C antibody  Result Value Ref Range   Hepatitis C Ab NON-REACTIVE NON-REACTIVE  HIV Antibody (routine testing w rflx)  Result Value Ref Range   HIV 1&2 Ab, 4th Generation NON-REACTIVE NON-REACTIVE  TSH  Result Value Ref Range   TSH 1.65 mIU/L  Cervicovaginal ancillary only  Result Value Ref Range   Neisseria Gonorrhea Negative    Chlamydia Negative    Trichomonas Negative    Bacterial Vaginitis (gardnerella) Negative    Candida Vaginitis Negative    Candida Glabrata Negative    Comment      Normal Reference Range Bacterial Vaginosis - Negative   Comment Normal Reference Range Candida Species - Negative    Comment Normal Reference Range Candida Galbrata - Negative    Comment Normal Reference Range Trichomonas - Negative    Comment Normal Reference Ranger Chlamydia - Negative    Comment      Normal Reference Range Neisseria Gonorrhea - Negative      Assessment & Plan:   Problem List Items Addressed This Visit       Other   Anxiety - Primary     Assessment and Plan    Anxiety Stable on Zoloft 25mg  daily. No side effects reported. Patient reports feeling good with the current dosage. -Continue Zoloft 25mg  daily. -Plan to reassess in 6 months or sooner if symptoms change.  Hyperlipidemia Plan to check lipoprotein A in 6 months to assess genetic risk. -Check lipoprotein A at next visit in 6 months.         Follow up plan: Return in about 6 months (around 09/16/2023) for follow up.

## 2023-08-11 ENCOUNTER — Ambulatory Visit: Payer: Self-pay | Admitting: Nurse Practitioner

## 2023-08-11 ENCOUNTER — Encounter: Payer: Self-pay | Admitting: Nurse Practitioner

## 2023-08-11 VITALS — BP 116/74 | HR 76 | Temp 98.1°F | Resp 18 | Ht 63.0 in | Wt 140.2 lb

## 2023-08-11 DIAGNOSIS — E782 Mixed hyperlipidemia: Secondary | ICD-10-CM | POA: Diagnosis not present

## 2023-08-11 DIAGNOSIS — F419 Anxiety disorder, unspecified: Secondary | ICD-10-CM

## 2023-08-11 DIAGNOSIS — Z131 Encounter for screening for diabetes mellitus: Secondary | ICD-10-CM

## 2023-08-11 DIAGNOSIS — F330 Major depressive disorder, recurrent, mild: Secondary | ICD-10-CM

## 2023-08-11 MED ORDER — SERTRALINE HCL 25 MG PO TABS: 25.0000 mg | ORAL_TABLET | Freq: Every day | ORAL | 1 refills | Status: DC

## 2023-08-11 NOTE — Progress Notes (Signed)
 BP 116/74   Pulse 76   Temp 98.1 F (36.7 C)   Resp 18   Ht 5\' 3"  (1.6 m)   Wt 140 lb 3.2 oz (63.6 kg)   LMP 08/06/2023   SpO2 99%   BMI 24.84 kg/m    Subjective:    Patient ID: Melissa Christensen, female    DOB: [birth year], 33 y.o.   MRN: [id number]  HPI: Melissa Christensen is a 33 y.o. female  Chief Complaint  Patient presents with   Medical Management of Chronic Issues    Discussed the use of AI scribe software for clinical note transcription with the patient, who gave verbal consent to proceed.  History of Present Illness Melissa Christensen is a 33 year old female with anxiety, depression, and hyperlipidemia who presents for a six-month follow-up.  She is currently taking Zoloft  25 mg daily, which has been effective in managing her mood. She reports no new symptoms or changes in her condition since the last visit. She has started a new exercise regimen at [facility], attending five days a week, which she feels has improved her mood and energy levels. The workouts involve athletic conditioning and body weight exercises, focusing on different body parts each day.  Her last lipid panel showed an LDL level of 149 mg/dL.  Lipid Panel     Component Value Date/Time   CHOL 232 (H) 01/02/2023 1006   TRIG 160 (H) 01/02/2023 1006   HDL 53 01/02/2023 1006   CHOLHDL 4.4 01/02/2023 1006   LDLCALC 149 (H) 01/02/2023 1006     She is planning a trip to [place], staying at an all-inclusive resort, which she is excited about.         08/11/2023    8:40 AM 03/18/2023    8:29 AM 02/12/2023    8:57 AM  Depression screen PHQ 2/9  Decreased Interest 0 0 0  Down, Depressed, Hopeless 0 0 0  PHQ - 2 Score 0 0 0  Altered sleeping 0 0 2  Tired, decreased energy 0 1 2  Change in appetite 0 0 2  Feeling bad or failure about yourself  0 0 0  Trouble concentrating 0 0 0  Moving slowly or fidgety/restless 0 0 0  Suicidal thoughts 0 0 0  PHQ-9 Score 0 1 6  Difficult doing work/chores Not difficult  at all Not difficult at all Not difficult at all       08/11/2023    8:42 AM 03/18/2023    8:30 AM 02/12/2023    8:57 AM 01/02/2023    9:34 AM  GAD 7 : Generalized Anxiety Score  Nervous, Anxious, on Edge 0 0 1 2  Control/stop worrying 0 0 1 2  Worry too much - different things 0 0 1 2  Trouble relaxing 0 0 1 2  Restless 0 0 0 0  Easily annoyed or irritable 0 1 2 3   Afraid - awful might happen 0 0 0 3  Total GAD 7 Score 0 1 6 14   Anxiety Difficulty Not difficult at all Not difficult at all Not difficult at all Somewhat difficult     Relevant past medical, surgical, family and social history reviewed and updated as indicated. Interim medical history since our last visit reviewed. Allergies and medications reviewed and updated.  Review of Systems  Constitutional: Negative for fever or weight change.  Respiratory: Negative for cough and shortness of breath.   Cardiovascular: Negative for chest pain or palpitations.  Gastrointestinal:  Negative for abdominal pain, no bowel changes.  Musculoskeletal: Negative for gait problem or joint swelling.  Skin: Negative for rash.  Neurological: Negative for dizziness or headache.  No other specific complaints in a complete review of systems (except as listed in HPI above).      Objective:    BP 116/74   Pulse 76   Temp 98.1 F (36.7 C)   Resp 18   Ht 5\' 3"  (1.6 m)   Wt 140 lb 3.2 oz (63.6 kg)   LMP 08/06/2023   SpO2 99%   BMI 24.84 kg/m    Wt Readings from Last 3 Encounters:  08/11/23 140 lb 3.2 oz (63.6 kg)  03/18/23 143 lb 3.2 oz (65 kg)  02/12/23 143 lb 3.2 oz (65 kg)    Physical Exam Physical Exam GENERAL: Alert, cooperative, well developed, no acute distress. HEENT: Normocephalic, normal oropharynx, moist mucous membranes. CHEST: Clear to auscultation bilaterally, no wheezes, rhonchi, or crackles. CARDIOVASCULAR: Normal heart rate and rhythm, S1 and S2 normal without murmurs. ABDOMEN: Soft, non-tender, non-distended,  without organomegaly, normal bowel sounds. EXTREMITIES: No cyanosis or edema. NEUROLOGICAL: Cranial nerves grossly intact, moves all extremities without gross motor or sensory deficit.        Assessment & Plan:   Problem List Items Addressed This Visit       Other   Anxiety   Relevant Medications   sertraline  (ZOLOFT ) 25 MG tablet   Mild episode of recurrent major depressive disorder (HCC)   Relevant Medications   sertraline  (ZOLOFT ) 25 MG tablet   Mixed hyperlipidemia - Primary   Relevant Orders   Lipid panel   Lipoprotein A (LPA)   Other Visit Diagnoses       Screening for diabetes mellitus       Relevant Orders   Comprehensive metabolic panel with GFR        Assessment and Plan Assessment & Plan Wellness Visit Routine wellness visit with no new concerns. Blood pressure is within normal range. Health maintenance is current. Regular physical activity, including burn boot camp five days a week, is positively impacting mood and energy levels. - Order comprehensive metabolic panel (CMP)   Hyperlipidemia Hyperlipidemia with previous LDL level of 149 mg/dL. Lipoprotein A test to be conducted for additional information, though it will not alter the current treatment plan. Regular exercise is anticipated to aid in cholesterol management. - Order lipoprotein A test - Repeat lipid panel  Depression/anxiety Depression is well-managed with Zoloft  25 mg daily. She reports good mood and no new symptoms. Regular exercise is contributing positively to mood and energy levels. - Continue Zoloft  25 mg daily - Provide medication refill as needed        Follow up plan: Return in about 6 months (around 02/10/2024) for cpe.

## 2023-08-15 LAB — COMPREHENSIVE METABOLIC PANEL WITH GFR
AG Ratio: 1.6 (calc) (ref 1.0–2.5)
ALT: 11 U/L (ref 6–29)
AST: 12 U/L (ref 10–30)
Albumin: 4.2 g/dL (ref 3.6–5.1)
Alkaline phosphatase (APISO): 47 U/L (ref 31–125)
BUN: 14 mg/dL (ref 7–25)
CO2: 26 mmol/L (ref 20–32)
Calcium: 9.2 mg/dL (ref 8.6–10.2)
Chloride: 104 mmol/L (ref 98–110)
Creat: 0.74 mg/dL (ref 0.50–0.97)
Globulin: 2.6 g/dL (ref 1.9–3.7)
Glucose, Bld: 66 mg/dL (ref 65–139)
Potassium: 4.7 mmol/L (ref 3.5–5.3)
Sodium: 139 mmol/L (ref 135–146)
Total Bilirubin: 0.4 mg/dL (ref 0.2–1.2)
Total Protein: 6.8 g/dL (ref 6.1–8.1)
eGFR: 109 mL/min/{1.73_m2} (ref >=60)

## 2023-08-15 LAB — LIPID PANEL
Cholesterol: 213 mg/dL — ABNORMAL HIGH (ref <200)
HDL: 53 mg/dL (ref >=50)
LDL Cholesterol (Calc): 129 mg/dL — ABNORMAL HIGH
Non-HDL Cholesterol (Calc): 160 mg/dL — ABNORMAL HIGH (ref <130)
Total CHOL/HDL Ratio: 4 (calc) (ref <5.0)
Triglycerides: 168 mg/dL — ABNORMAL HIGH (ref <150)

## 2023-08-15 LAB — LIPOPROTEIN A (LPA): Lipoprotein (a): 255 nmol/L — ABNORMAL HIGH (ref <75)

## 2023-08-18 ENCOUNTER — Encounter: Payer: Self-pay | Admitting: Nurse Practitioner

## 2023-09-17 ENCOUNTER — Ambulatory Visit: Payer: Self-pay | Admitting: Nurse Practitioner

## 2023-10-08 ENCOUNTER — Encounter: Payer: Self-pay | Admitting: Nurse Practitioner

## 2023-10-09 ENCOUNTER — Ambulatory Visit (INDEPENDENT_AMBULATORY_CARE_PROVIDER_SITE_OTHER): Admitting: Nurse Practitioner

## 2023-10-09 ENCOUNTER — Encounter: Payer: Self-pay | Admitting: Nurse Practitioner

## 2023-10-09 VITALS — BP 118/76 | HR 64 | Resp 18 | Ht 62.5 in | Wt 136.0 lb

## 2023-10-09 DIAGNOSIS — Z308 Encounter for other contraceptive management: Secondary | ICD-10-CM | POA: Diagnosis not present

## 2023-10-09 DIAGNOSIS — Z0000 Encounter for general adult medical examination without abnormal findings: Secondary | ICD-10-CM

## 2023-10-09 MED ORDER — LEVONORGESTREL-ETHINYL ESTRAD 0.15-30 MG-MCG PO TABS: 1.0000 | ORAL_TABLET | Freq: Every day | ORAL | 3 refills | Status: AC

## 2023-10-09 NOTE — Progress Notes (Signed)
 Name: Melissa Christensen   MRN: [id number]    DOB: 11-03-1990   Date:10/09/2023       Progress Note  Subjective  Chief Complaint  Chief Complaint  Patient presents with   Annual Exam   Medication Refill    Birth control    HPI  Patient presents for annual CPE.  Diet: well balanced Exercise: burn boot camp, walks every day 3 miles  Sleep: 7-8 hours Last dental exam:needs schedule Last eye exam: unknown  Flowsheet Row [facility]  AUDIT-C Score 0   Depression: Phq 9 is  negative    10/09/2023    2:51 PM 08/11/2023    8:40 AM 03/18/2023    8:29 AM 02/12/2023    8:57 AM 01/02/2023    9:34 AM  Depression screen PHQ 2/9  Decreased Interest 0 0 0 0 0  Down, Depressed, Hopeless 0 0 0 0 0  PHQ - 2 Score 0 0 0 0 0  Altered sleeping 0 0 0 2 0  Tired, decreased energy 0 0 1 2 1   Change in appetite 0 0 0 2 0  Feeling bad or failure about yourself  0 0 0 0 0  Trouble concentrating 0 0 0 0 1  Moving slowly or fidgety/restless 0 0 0 0 0  Suicidal thoughts 0 0 0 0 0  PHQ-9 Score 0 0 1 6 2   Difficult doing work/chores Not difficult at all Not difficult at all Not difficult at all Not difficult at all Not difficult at all   Hypertension: BP Readings from Last 3 Encounters:  10/09/23 118/76  08/11/23 116/74  03/18/23 112/82   Obesity: Wt Readings from Last 3 Encounters:  10/09/23 136 lb (61.7 kg)  08/11/23 140 lb 3.2 oz (63.6 kg)  03/18/23 143 lb 3.2 oz (65 kg)   BMI Readings from Last 3 Encounters:  10/09/23 24.48 kg/m  08/11/23 24.84 kg/m  03/18/23 25.37 kg/m     Vaccines:  HPV: up to at age 36 , ask insurance if age between 79-45  Shingrix: 31-64 yo and ask insurance if covered when patient above 74 yo Pneumonia:  educated and discussed with patient. Flu:  educated and discussed with patient.  Hep C Screening: completed STD testing and prevention (HIV/chl/gon/syphilis): completed Intimate partner  violence:none Sexual History :sexually active Menstrual History/LMP/Abnormal Bleeding: L[facility]:  09/25/2023 currently on OCP,  needs refill Incontinence Symptoms: none  Breast cancer:  - Last Mammogram: does not qualify - BRCA gene screening: none  Osteoporosis: Discussed high calcium and vitamin D supplementation, weight bearing exercises  Cervical cancer screening: 01/10/2022  Skin cancer: Discussed monitoring for atypical lesions  Colorectal cancer: does not qualify   Lung cancer:   Low Dose CT Chest recommended if Age 33-80 years, 20 pack-year currently smoking OR have quit w/in 15years. Patient does not qualify.   ECG: none  Advanced Care Planning: A voluntary discussion about advance care planning including the explanation and discussion of advance directives.  Discussed health care proxy and Living will, and the patient was able to identify a health care proxy as husband.  Patient does not have a living will at present time. If patient does have living will, I have requested they bring this to the clinic to be scanned in to their chart.  Lipids: Lab Results  Component Value Date   CHOL 213 (H) 08/11/2023   CHOL 232 (H) 01/02/2023   Lab Results  Component Value Date  HDL 53 08/11/2023   HDL 53 01/02/2023   Lab Results  Component Value Date   LDLCALC 129 (H) 08/11/2023   LDLCALC 149 (H) 01/02/2023   Lab Results  Component Value Date   TRIG 168 (H) 08/11/2023   TRIG 160 (H) 01/02/2023   Lab Results  Component Value Date   CHOLHDL 4.0 08/11/2023   CHOLHDL 4.4 01/02/2023   No results found for: LDLDIRECT  Glucose: Glucose, Bld  Date Value Ref Range Status  08/11/2023 66 65 - 139 mg/dL Final    Comment:    .        Non-fasting reference interval .   01/02/2023 84 65 - 99 mg/dL Final    Comment:    .            Fasting reference interval .   05/01/2018 92 65 - 99 mg/dL Final    Comment:    .            Fasting reference interval .     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   Eczema 03/10/2018    Past Surgical History:  Procedure Laterality Date   NO PAST SURGERIES      Family History  Problem Relation Age of Onset   Hypertension Mother    Healthy Father    Healthy Sister    Dementia Maternal Grandmother    Emphysema Maternal Grandfather 105   Diabetes Paternal Grandmother    Heart attack Paternal Grandfather        In his 15's   Healthy Sister     Social History   Socioeconomic History   Marital status: Married    Spouse name: Josh   Number of children: 2   Years of education: Not on file   Highest education level: Not on file  Occupational History   Not on file  Tobacco Use   Smoking status: Never   Smokeless tobacco: Never  Vaping Use   Vaping status: Never Used  Substance and Sexual Activity   Alcohol use: Never   Drug use: Never   Sexual activity: Yes    Partners: Male    Birth control/protection: Pill  Other Topics Concern   Not on file  Social History Narrative   She has a 6 year ol daughter and 26 year old son at home, she is married, and has 1 dogs.    Social Drivers of Corporate investment banker Strain: Low Risk  (10/09/2023)   Overall Financial Resource Strain (CARDIA)    Difficulty of Paying Living Expenses: Not hard at all  Food Insecurity: No Food Insecurity (10/09/2023)   Hunger Vital Sign    Worried About Running Out of Food in the Last Year: Never true    Ran Out of Food in the Last Year: Never true  Transportation Needs: No Transportation Needs (10/09/2023)   PRAPARE - Administrator, Civil Service (Medical): No    Lack of Transportation (Non-Medical): No  Physical Activity: Sufficiently Active (10/09/2023)   Exercise Vital Sign    Days of Exercise per Week: 4 days    Minutes of Exercise per Session: 50  min  Stress: Stress Concern Present (10/09/2023)   [employer] of Occupational Health - Occupational Stress Questionnaire    Feeling of Stress: To some extent  Social Connections: Moderately Integrated (10/09/2023)   Social Connection and Isolation Panel    Frequency of Communication with Friends and Family: More than three times a week    Frequency of Social Gatherings with Friends and Family: More than three times a week    Attends Religious Services: More than 4 times per year    Active Member of [employer] or Organizations: No    Attends Banker Meetings: Never    Marital Status: Married  Catering manager Violence: Not At Risk (10/09/2023)   Humiliation, Afraid, Rape, and Kick questionnaire    Fear of Current or Ex-Partner: No    Emotionally Abused: No    Physically Abused: No    Sexually Abused: No     Current Outpatient Medications:    sertraline  (ZOLOFT ) 25 MG tablet, Take 1 tablet (25 mg total) by mouth daily., Disp: 90 tablet, Rfl: 1   levonorgestrel-ethinyl estradiol (NORDETTE) 0.15-30 MG-MCG tablet, Take 1 tablet by mouth daily., Disp: 90 tablet, Rfl: 3  No Known Allergies   ROS  Constitutional: Negative for fever or weight change.  Respiratory: Negative for cough and shortness of breath.   Cardiovascular: Negative for chest pain or palpitations.  Gastrointestinal: Negative for abdominal pain, no bowel changes.  Musculoskeletal: Negative for gait problem or joint swelling.  Skin: Negative for rash.  Neurological: Negative for dizziness or headache.  No other specific complaints in a complete review of systems (except as listed in HPI above).   Objective  Vitals:   10/09/23 1449  BP: 118/76  Pulse: 64  Resp: 18  SpO2: 98%  Weight: 136 lb (61.7 kg)  Height: 5' 2.5 (1.588 m)    Body mass index is 24.48 kg/m.  Physical Exam Vitals reviewed.  Constitutional:      Appearance: Normal appearance.  HENT:     Head: Normocephalic.     Right Ear:  Tympanic membrane normal.     Left Ear: Tympanic membrane normal.     Nose: Nose normal.   Eyes:     Extraocular Movements: Extraocular movements intact.     Conjunctiva/sclera: Conjunctivae normal.     Pupils: Pupils are equal, round, and reactive to light.   Neck:     Thyroid: No thyroid mass, thyromegaly or thyroid tenderness.   Cardiovascular:     Rate and Rhythm: Normal rate and regular rhythm.     Pulses: Normal pulses.     Heart sounds: Normal heart sounds.  Pulmonary:     Effort: Pulmonary effort is normal.     Breath sounds: Normal breath sounds.  Abdominal:     General: Bowel sounds are normal.     Palpations: Abdomen is soft.   Musculoskeletal:        General: Normal range of motion.     Cervical back: Normal range of motion and neck supple.     Right lower leg: No edema.     Left lower leg: No edema.   Skin:    General: Skin is warm and dry.     Capillary Refill: Capillary refill takes less than 2 seconds.   Neurological:     General: No focal deficit present.     Mental Status: She is alert and oriented to person, place, and time. Mental status is at baseline.   Psychiatric:        Mood and Affect: Mood normal.        Behavior: Behavior normal.        Thought Content: Thought  content normal.        Judgment: Judgment normal.      Recent Results (from the past 2160 hours)  Comprehensive metabolic panel with GFR     Status: None   Collection Time: 08/11/23  9:00 AM  Result Value Ref Range   Glucose, Bld 66 65 - 139 mg/dL    Comment: .        Non-fasting reference interval .    BUN 14 7 - 25 mg/dL   Creat 9.25 9.49 - 9.02 mg/dL   eGFR 890 > OR = 60 fO/fpw/8.26f7   BUN/Creatinine Ratio SEE NOTE: 6 - 22 (calc)    Comment:    Not Reported: BUN and Creatinine are within    reference range. .    Sodium 139 135 - 146 mmol/L   Potassium 4.7 3.5 - 5.3 mmol/L   Chloride 104 98 - 110 mmol/L   CO2 26 20 - 32 mmol/L   Calcium 9.2 8.6 - 10.2 mg/dL    Total Protein 6.8 6.1 - 8.1 g/dL   Albumin 4.2 3.6 - 5.1 g/dL   Globulin 2.6 1.9 - 3.7 g/dL (calc)   AG Ratio 1.6 1.0 - 2.5 (calc)   Total Bilirubin 0.4 0.2 - 1.2 mg/dL   Alkaline phosphatase (APISO) 47 31 - 125 U/L   AST 12 10 - 30 U/L   ALT 11 6 - 29 U/L  Lipid panel     Status: Abnormal   Collection Time: 08/11/23  9:00 AM  Result Value Ref Range   Cholesterol 213 (H) <200 mg/dL   HDL 53 > OR = 50 mg/dL   Triglycerides 831 (H) <150 mg/dL   LDL Cholesterol (Calc) 129 (H) mg/dL (calc)    Comment: Reference range: <100 . Desirable range <100 mg/dL for primary prevention;   <70 mg/dL for patients with CHD or diabetic patients  with > or = 2 CHD risk factors. SABRA LDL-C is now calculated using the Martin-Hopkins  calculation, which is a validated novel method providing  better accuracy than the Friedewald equation in the  estimation of LDL-C.  Gladis APPLETHWAITE et al. SANDREA. 7986;689(80): 2061-2068  (http://education.[facility].com/faq/FAQ164)    Total CHOL/HDL Ratio 4.0 <5.0 (calc)   Non-HDL Cholesterol (Calc) 160 (H) <130 mg/dL (calc)    Comment: For patients with diabetes plus 1 major ASCVD risk  factor, treating to a non-HDL-C goal of <100 mg/dL  (LDL-C of <29 mg/dL) is considered a therapeutic  option.   Lipoprotein A (LPA)     Status: Abnormal   Collection Time: 08/11/23  9:00 AM  Result Value Ref Range   Lipoprotein (a) 255 (H) <75 nmol/L    Comment: . Risk Category   Optimal        < 75 nmol/L   Moderate   75 - 125 nmol/L   High          > 125 nmol/L . Cardiovascular event risk category cut points (optimal, moderate, high) are based on Tsimika S. JACC 2017;69:692-711. .     Fall Risk:    10/09/2023    2:51 PM 08/11/2023    8:36 AM 03/18/2023    8:29 AM 02/12/2023    8:54 AM 01/02/2023    9:33 AM  Fall Risk   Falls in the past year? 0 0 0 0 0  Number falls in past yr: 0 0  0 0  Injury with Fall? 0 0  0 0  Risk for fall due to : No  Fall Risks  No Fall Risks  No Fall Risks No Fall Risks  Follow up Falls prevention discussed;Education provided;Falls evaluation completed Falls evaluation completed Falls prevention discussed Falls prevention discussed      Functional Status Survey: Is the patient deaf or have difficulty hearing?: No Does the patient have difficulty seeing, even when wearing glasses/contacts?: No Does the patient have difficulty concentrating, remembering, or making decisions?: No Does the patient have difficulty walking or climbing stairs?: No Does the patient have difficulty dressing or bathing?: No Does the patient have difficulty doing errands alone such as visiting a doctor's office or shopping?: No   Assessment & Plan  1. Annual physical exam (Primary) - labs recently done -discussed HLD  2. Encounter for other contraceptive management  - levonorgestrel-ethinyl estradiol (NORDETTE) 0.15-30 MG-MCG tablet; Take 1 tablet by mouth daily.  Dispense: 90 tablet; Refill: 3   -USPSTF grade A and B recommendations reviewed with patient; age-appropriate recommendations, preventive care, screening tests, etc discussed and encouraged; healthy living encouraged; see AVS for patient education given to patient -Discussed importance of 150 minutes of physical activity weekly, eat two servings of fish weekly, eat one serving of tree nuts ( cashews, pistachios, pecans, almonds.SABRA) every other day, eat 6 servings of fruit/vegetables daily and drink plenty of water and avoid sweet beverages.   -Reviewed Health Maintenance: yes

## 2023-10-20 ENCOUNTER — Encounter: Admitting: Nurse Practitioner

## 2024-02-10 ENCOUNTER — Ambulatory Visit: Admitting: Nurse Practitioner

## 2024-02-23 ENCOUNTER — Ambulatory Visit: Admitting: Nurse Practitioner

## 2024-03-19 ENCOUNTER — Other Ambulatory Visit: Payer: Self-pay | Admitting: Nurse Practitioner

## 2024-03-19 DIAGNOSIS — F330 Major depressive disorder, recurrent, mild: Secondary | ICD-10-CM

## 2024-03-19 DIAGNOSIS — F419 Anxiety disorder, unspecified: Secondary | ICD-10-CM

## 2024-03-22 NOTE — Telephone Encounter (Signed)
 Second request
